# Patient Record
Sex: Male | Born: 1950 | Race: Black or African American | Hispanic: No | Marital: Single | Smoking: Never smoker
Health system: Southern US, Community
[De-identification: ages and names within clinical notes are randomized; demographics above are authoritative.]

## PROBLEM LIST (undated history)

## (undated) DIAGNOSIS — M1990 Unspecified osteoarthritis, unspecified site: Secondary | ICD-10-CM

## (undated) DIAGNOSIS — K7689 Other specified diseases of liver: Secondary | ICD-10-CM

## (undated) DIAGNOSIS — H547 Unspecified visual loss: Secondary | ICD-10-CM

## (undated) DIAGNOSIS — K759 Inflammatory liver disease, unspecified: Secondary | ICD-10-CM

## (undated) DIAGNOSIS — B20 Human immunodeficiency virus [HIV] disease: Secondary | ICD-10-CM

## (undated) DIAGNOSIS — I10 Essential (primary) hypertension: Secondary | ICD-10-CM

## (undated) DIAGNOSIS — I509 Heart failure, unspecified: Secondary | ICD-10-CM

## (undated) DIAGNOSIS — Z85048 Personal history of other malignant neoplasm of rectum, rectosigmoid junction, and anus: Secondary | ICD-10-CM

## (undated) DIAGNOSIS — H409 Unspecified glaucoma: Secondary | ICD-10-CM

## (undated) DIAGNOSIS — Z21 Asymptomatic human immunodeficiency virus [HIV] infection status: Secondary | ICD-10-CM

## (undated) HISTORY — DX: Inflammatory liver disease, unspecified: K75.9

## (undated) HISTORY — PX: GLAUCOMA SURGERY: SHX656

## (undated) HISTORY — DX: Unspecified visual loss: H54.7

## (undated) HISTORY — DX: Personal history of other malignant neoplasm of rectum, rectosigmoid junction, and anus: Z85.048

## (undated) HISTORY — DX: Other specified diseases of liver: K76.89

## (undated) HISTORY — DX: Heart failure, unspecified: I50.9

## (undated) HISTORY — PX: LAPAROSCOPIC DIVERTED COLOSTOMY: SHX5892

---

## 2017-06-23 DIAGNOSIS — N493 Fournier gangrene: Secondary | ICD-10-CM | POA: Insufficient documentation

## 2019-01-07 ENCOUNTER — Emergency Department (HOSPITAL_COMMUNITY): Payer: Medicare Other

## 2019-01-07 ENCOUNTER — Other Ambulatory Visit: Payer: Self-pay

## 2019-01-07 ENCOUNTER — Emergency Department (HOSPITAL_COMMUNITY)
Admission: EM | Admit: 2019-01-07 | Discharge: 2019-01-08 | Disposition: A | Discharge status: Home / Self Care | Payer: Medicare Other | Attending: Emergency Medicine | Admitting: Emergency Medicine

## 2019-01-07 ENCOUNTER — Encounter (HOSPITAL_COMMUNITY): Payer: Self-pay

## 2019-01-07 DIAGNOSIS — I10 Essential (primary) hypertension: Secondary | ICD-10-CM | POA: Insufficient documentation

## 2019-01-07 DIAGNOSIS — Z21 Asymptomatic human immunodeficiency virus [HIV] infection status: Secondary | ICD-10-CM | POA: Insufficient documentation

## 2019-01-07 DIAGNOSIS — R600 Localized edema: Secondary | ICD-10-CM | POA: Insufficient documentation

## 2019-01-07 DIAGNOSIS — M79604 Pain in right leg: Secondary | ICD-10-CM | POA: Diagnosis present

## 2019-01-07 DIAGNOSIS — Z79899 Other long term (current) drug therapy: Secondary | ICD-10-CM | POA: Diagnosis not present

## 2019-01-07 HISTORY — DX: Asymptomatic human immunodeficiency virus (hiv) infection status: Z21

## 2019-01-07 HISTORY — DX: Human immunodeficiency virus (HIV) disease: B20

## 2019-01-07 HISTORY — DX: Unspecified osteoarthritis, unspecified site: M19.90

## 2019-01-07 HISTORY — DX: Unspecified glaucoma: H40.9

## 2019-01-07 HISTORY — DX: Essential (primary) hypertension: I10

## 2019-01-07 LAB — CBC WITH DIFFERENTIAL/PLATELET
Abs Immature Granulocytes: 0.01 10*3/uL (ref 0.00–0.07)
Basophils Absolute: 0 10*3/uL (ref 0.0–0.1)
Basophils Relative: 0 %
Eosinophils Absolute: 0.3 10*3/uL (ref 0.0–0.5)
Eosinophils Relative: 5 %
HCT: 34.3 % — ABNORMAL LOW (ref 39.0–52.0)
Hemoglobin: 11 g/dL — ABNORMAL LOW (ref 13.0–17.0)
Immature Granulocytes: 0 %
Lymphocytes Relative: 43 %
Lymphs Abs: 2 10*3/uL (ref 0.7–4.0)
MCH: 31.3 pg (ref 26.0–34.0)
MCHC: 32.1 g/dL (ref 30.0–36.0)
MCV: 97.4 fL (ref 80.0–100.0)
Monocytes Absolute: 0.6 10*3/uL (ref 0.1–1.0)
Monocytes Relative: 13 %
Neutro Abs: 1.8 10*3/uL (ref 1.7–7.7)
Neutrophils Relative %: 39 %
Platelets: 194 10*3/uL (ref 150–400)
RBC: 3.52 MIL/uL — ABNORMAL LOW (ref 4.22–5.81)
RDW: 14.6 % (ref 11.5–15.5)
WBC: 4.6 10*3/uL (ref 4.0–10.5)
nRBC: 0 % (ref 0.0–0.2)

## 2019-01-07 LAB — COMPREHENSIVE METABOLIC PANEL
ALT: 25 U/L (ref 0–44)
AST: 27 U/L (ref 15–41)
Albumin: 3.5 g/dL (ref 3.5–5.0)
Alkaline Phosphatase: 84 U/L (ref 38–126)
Anion gap: 6 (ref 5–15)
BUN: 18 mg/dL (ref 8–23)
CO2: 25 mmol/L (ref 22–32)
Calcium: 8.6 mg/dL — ABNORMAL LOW (ref 8.9–10.3)
Chloride: 106 mmol/L (ref 98–111)
Creatinine, Ser: 0.76 mg/dL (ref 0.61–1.24)
GFR calc Af Amer: >60 mL/min (ref >60)
GFR calc non Af Amer: >60 mL/min (ref >60)
Glucose, Bld: 97 mg/dL (ref 70–99)
Potassium: 3.2 mmol/L — ABNORMAL LOW (ref 3.5–5.1)
Sodium: 137 mmol/L (ref 135–145)
Total Bilirubin: 0.7 mg/dL (ref 0.3–1.2)
Total Protein: 7.4 g/dL (ref 6.5–8.1)

## 2019-01-07 LAB — TROPONIN I (HIGH SENSITIVITY): Troponin I (High Sensitivity): 3 ng/L (ref <18)

## 2019-01-07 LAB — URINALYSIS, ROUTINE W REFLEX MICROSCOPIC
Bilirubin Urine: NEGATIVE
Glucose, UA: NEGATIVE mg/dL
Hgb urine dipstick: NEGATIVE
Ketones, ur: NEGATIVE mg/dL
Leukocytes,Ua: NEGATIVE
Nitrite: NEGATIVE
Protein, ur: NEGATIVE mg/dL
Specific Gravity, Urine: 1.014 (ref 1.005–1.030)
pH: 6 (ref 5.0–8.0)

## 2019-01-07 LAB — BRAIN NATRIURETIC PEPTIDE: B Natriuretic Peptide: 22 pg/mL (ref 0.0–100.0)

## 2019-01-07 MED ORDER — FUROSEMIDE 20 MG PO TABS: 20.0000 mg | ORAL_TABLET | Freq: Every day | ORAL | 11 refills | Status: DC

## 2019-01-07 MED ORDER — FUROSEMIDE 40 MG PO TABS
40.0000 mg | ORAL_TABLET | Freq: Once | ORAL | Status: AC
  Administered 2019-01-07: 40 mg via ORAL
  Filled 2019-01-07: qty 1

## 2019-01-07 MED ORDER — POTASSIUM CHLORIDE ER 10 MEQ PO TBCR: 10.0000 meq | EXTENDED_RELEASE_TABLET | Freq: Every day | ORAL | 2 refills | Status: DC

## 2019-01-07 MED ORDER — POTASSIUM CHLORIDE CRYS ER 20 MEQ PO TBCR
40.0000 meq | EXTENDED_RELEASE_TABLET | Freq: Once | ORAL | Status: AC
  Administered 2019-01-07: 40 meq via ORAL
  Filled 2019-01-07: qty 2

## 2019-01-07 NOTE — ED Triage Notes (Signed)
Pt arrives from home via [facility] c/o bilateral feet swelling. Pt reports swelling has been progressively getting worse over the past few days. Swelling noted to be moving up legs.

## 2019-01-07 NOTE — Discharge Instructions (Addendum)
Elevate your legs when they begin to swell.   Get your records from your MD.   Schedule to see a local MD.

## 2019-01-07 NOTE — ED Notes (Signed)
Condom catheter placed on patient.

## 2019-01-07 NOTE — ED Notes (Signed)
ED Provider at bedside. 

## 2019-01-07 NOTE — ED Notes (Signed)
EKG done and seen by Dr Thurnell Garbe

## 2019-01-08 NOTE — ED Notes (Signed)
Pt being discharged  Second troponin canceled  First troponin within normal limits

## 2019-01-08 NOTE — ED Provider Notes (Signed)
[facility] EMERGENCY DEPARTMENT Provider Note   CSN: [id number] Arrival date & time: 01/07/19  2118     History   Chief Complaint Chief Complaint  Patient presents with  . Foot Swelling    HPI Ruben Powell is a 68 y.o. male.     The history is provided by the patient. No language interpreter was used.  Leg Pain Location:  Leg Injury: no   Leg location:  L leg, R leg, L lower leg and R lower leg Pain details:    Quality:  Aching   Severity:  No pain   Onset quality:  Gradual   Timing:  Constant   Progression:  Worsening Chronicity:  New Relieved by:  Nothing Worsened by:  Nothing Ineffective treatments:  None tried Risk factors: no concern for non-accidental trauma    Pt is blind.  He reports he is out of colostomy bags.  He has swelling in his legs.  Pt states he has HIV and some type of cancer.  He does not know what kind.   Pt just moved to [place] no MD.  Past Medical History:  Diagnosis Date  . Arthritis   . Glaucoma    bilateral eyes, blindness  . HIV (human immunodeficiency virus infection) ([place])   . Hypertension     There are no active problems to display for this patient.   History reviewed. No pertinent surgical history.      Home Medications    Prior to Admission medications   Medication Sig Start Date End Date Taking? Authorizing Provider  allopurinol (ZYLOPRIM) 100 MG tablet Take 100 mg by mouth 2 (two) times daily.   Yes [provider]  amLODipine (NORVASC) 10 MG tablet Take 10 mg by mouth daily.   Yes [provider]  emtricitabine (EMTRIVA) 200 MG capsule Take 200 mg by mouth daily. Medication called [place]   Yes [provider]  metoprolol succinate (TOPROL-XL) 50 MG 24 hr tablet Take 50 mg by mouth daily. Take with or immediately following a meal.   Yes [provider]  furosemide (LASIX) 20 MG tablet Take 1 tablet (20 mg total) by mouth daily. 01/07/19 01/07/20  Fransico Meadow, PA-C  potassium  chloride (K-DUR) 10 MEQ tablet Take 1 tablet (10 mEq total) by mouth daily. 01/07/19   Fransico Meadow, PA-C    Family History Family History  Problem Relation Age of Onset  . Cancer Mother     Social History Social History   Tobacco Use  . Smoking status: Never Smoker  . Smokeless tobacco: Never Used  Substance Use Topics  . Alcohol use: Yes  . Drug use: Not Currently     Allergies   Patient has no known allergies.   Review of Systems Review of Systems  Cardiovascular: Positive for leg swelling.  Musculoskeletal: Positive for myalgias.  All other systems reviewed and are negative.    Physical Exam Updated Vital Signs BP 106/88 (BP Location: Right Arm)   Pulse (!) 59   Temp 98 F (36.7 C) (Oral)   Resp 18   Ht 5\' 11"  (1.803 m)   Wt 68 kg   SpO2 97%   BMI 20.92 kg/m   Physical Exam Vitals signs and nursing note reviewed.  Constitutional:      Appearance: He is well-developed.  HENT:     Head: Normocephalic and atraumatic.  Eyes:     Conjunctiva/sclera: Conjunctivae normal.  Neck:     Musculoskeletal: Neck supple.  Cardiovascular:  Rate and Rhythm: Normal rate and regular rhythm.     Heart sounds: No murmur.  Pulmonary:     Effort: Pulmonary effort is normal. No respiratory distress.     Breath sounds: Normal breath sounds.  Abdominal:     Palpations: Abdomen is soft.     Tenderness: There is no abdominal tenderness.  Musculoskeletal:     Right lower leg: Edema present.     Left lower leg: Edema present.  Skin:    General: Skin is warm and dry.  Neurological:     General: No focal deficit present.     Mental Status: He is alert.      ED Treatments / Results  Labs (all labs ordered are listed, but only abnormal results are displayed) Labs Reviewed  CBC WITH DIFFERENTIAL/PLATELET - Abnormal; Notable for the following components:      Result Value   RBC 3.52 (*)    Hemoglobin 11.0 (*)    HCT 34.3 (*)    All other components within  normal limits  COMPREHENSIVE METABOLIC PANEL - Abnormal; Notable for the following components:   Potassium 3.2 (*)    Calcium 8.6 (*)    All other components within normal limits  URINALYSIS, ROUTINE W REFLEX MICROSCOPIC  BRAIN NATRIURETIC PEPTIDE  TROPONIN I (HIGH SENSITIVITY)  TROPONIN I (HIGH SENSITIVITY)    EKG EKG Interpretation  Date/Time:  Friday January 07 2019 22:11:22 EDT Ventricular Rate:  56 PR Interval:    QRS Duration: 163 QT Interval:  497 QTC Calculation: 480 R Axis:   70 Text Interpretation:  Sinus rhythm Prolonged PR interval Right bundle branch block No old tracing to compare Confirmed by Samuel JesterMcManus, Kathleen [phone]([id number]) on 01/07/2019 10:14:45 PM   Radiology Dg Chest Port 1 View  Result Date: 01/07/2019 CLINICAL DATA:  Lower extremity swelling history of hypertension, HIV EXAM: PORTABLE CHEST 1 VIEW COMPARISON:  None. FINDINGS: Features of interstitial edema with cephalized vascularity septal thickening. No focal consolidation. No pneumothorax or effusion. Prominent cardiac silhouette may be related to portable technique. The aorta is calcified and mildly tortuous. No acute osseous or soft tissue abnormality. IMPRESSION: Findings compatible with CHF. Electronically Signed   By: Kreg ShropshirePrice  DeHay M.D.   On: 01/07/2019 21:57    Procedures Procedures (including critical care time)  Medications Ordered in ED Medications  furosemide (LASIX) tablet 40 mg (40 mg Oral Given 01/07/19 2324)  potassium chloride SA (K-DUR) CR tablet 40 mEq (40 mEq Oral Given 01/07/19 2323)     Initial Impression / Assessment and Plan / ED Course  I have reviewed the triage vital signs and the nursing notes.  Pertinent labs & imaging results that were available during my care of the patient were reviewed by me and considered in my medical decision making (see chart for details).        Pt given lasix 20 mg and potassium 40 meq.  Home health and social work consult requested.   Final Clinical  Impressions(s) / ED Diagnoses   Final diagnoses:  Bilateral lower extremity edema    ED Discharge Orders         Ordered    furosemide (LASIX) 20 MG tablet  Daily     01/07/19 2341    potassium chloride (K-DUR) 10 MEQ tablet  Daily     01/07/19 2342        An After Visit Summary was printed and given to the patient.    Elson AreasSofia, Leslie K, [place]PA-C 01/08/19 [phone]  Samuel JesterMcManus, Kathleen, DO 01/09/19 1524

## 2019-01-13 ENCOUNTER — Ambulatory Visit: Payer: Self-pay

## 2019-01-14 ENCOUNTER — Other Ambulatory Visit: Payer: Self-pay

## 2019-01-14 ENCOUNTER — Encounter (HOSPITAL_COMMUNITY): Payer: Self-pay | Admitting: *Deleted

## 2019-01-14 ENCOUNTER — Emergency Department (HOSPITAL_COMMUNITY)
Admission: EM | Admit: 2019-01-14 | Discharge: 2019-01-14 | Disposition: A | Discharge status: Home / Self Care | Payer: Medicare Other | Attending: Emergency Medicine | Admitting: Emergency Medicine

## 2019-01-14 DIAGNOSIS — I10 Essential (primary) hypertension: Secondary | ICD-10-CM | POA: Diagnosis not present

## 2019-01-14 DIAGNOSIS — B20 Human immunodeficiency virus [HIV] disease: Secondary | ICD-10-CM | POA: Insufficient documentation

## 2019-01-14 DIAGNOSIS — Z79899 Other long term (current) drug therapy: Secondary | ICD-10-CM | POA: Diagnosis not present

## 2019-01-14 DIAGNOSIS — K9409 Other complications of colostomy: Secondary | ICD-10-CM | POA: Insufficient documentation

## 2019-01-14 NOTE — ED Triage Notes (Signed)
Pt brought in by [facility] with c/o intestines coming through his colostomy bag for the last few hours. EMS reports that this has happened several times and pt reports "it usually goes back on it's own". Pt denies pain.

## 2019-01-14 NOTE — ED Provider Notes (Signed)
[facility] EMERGENCY DEPARTMENT Provider Note   CSN: [id number] Arrival date & time: 01/14/19  1655     History   Chief Complaint Chief Complaint  Patient presents with  . Colostomy Problem    HPI Ruben Powell is a 68 y.o. male.     The history is provided by the patient. No language interpreter was used.     68 year old male with history of HIV, he also has a colostomy bag presenting complaining of herniations of his ostomy bag.  Patient states since this morning, he notices intestines oozing out from his ostomy.  He denies any associated abdominal pain nausea vomiting or fever.  He denies any recent trauma.  He mention this has happened in the past and usually retract on its own.  It normally happens once or twice a month.  He does not have any other specific complaint.  He last changed the ostomy bag 4 days ago.  It is usually changed once weekly.  He has had colostomy for the past 2 years.  Past Medical History:  Diagnosis Date  . Arthritis   . Glaucoma    bilateral eyes, blindness  . HIV (human immunodeficiency virus infection) ([place])   . Hypertension     There are no active problems to display for this patient.   History reviewed. No pertinent surgical history.      Home Medications    Prior to Admission medications   Medication Sig Start Date End Date Taking? Authorizing Provider  allopurinol (ZYLOPRIM) 100 MG tablet Take 100 mg by mouth 2 (two) times daily.    [provider]  amLODipine (NORVASC) 10 MG tablet Take 10 mg by mouth daily.    [provider]  emtricitabine (EMTRIVA) 200 MG capsule Take 200 mg by mouth daily. Medication called [place]    [provider]  furosemide (LASIX) 20 MG tablet Take 1 tablet (20 mg total) by mouth daily. 01/07/19 01/07/20  Fransico Meadow, PA-C  metoprolol succinate (TOPROL-XL) 50 MG 24 hr tablet Take 50 mg by mouth daily. Take with or immediately following a meal.    [provider]   potassium chloride (K-DUR) 10 MEQ tablet Take 1 tablet (10 mEq total) by mouth daily. 01/07/19   Fransico Meadow, PA-C    Family History Family History  Problem Relation Age of Onset  . Cancer Mother     Social History Social History   Tobacco Use  . Smoking status: Never Smoker  . Smokeless tobacco: Never Used  Substance Use Topics  . Alcohol use: Yes  . Drug use: Not Currently     Allergies   Patient has no known allergies.   Review of Systems Review of Systems  All other systems reviewed and are negative.    Physical Exam Updated Vital Signs Ht 5\' 11"  (1.803 m)   Wt 68 kg   BMI 20.92 kg/m   Physical Exam Vitals signs and nursing note reviewed.  Constitutional:      General: He is not in acute distress.    Appearance: He is well-developed.  HENT:     Head: Atraumatic.  Eyes:     Conjunctiva/sclera: Conjunctivae normal.  Neck:     Musculoskeletal: Neck supple.  Abdominal:     General: Abdomen is flat.     Palpations: Abdomen is soft.     Tenderness: There is no abdominal tenderness.     Comments: My intestinal herniation through the stoma in the colostomy bag on the  left abdomen.  It is nontender to palpation.  Stoma with normal skin appearance.  Bowel sounds are present.  Skin:    Findings: No rash.  Neurological:     Mental Status: He is alert.      ED Treatments / Results  Labs (all labs ordered are listed, but only abnormal results are displayed) Labs Reviewed - No data to display  EKG None  Radiology No results found.  Procedures Procedures (including critical care time)  Medications Ordered in ED Medications - No data to display   Initial Impression / Assessment and Plan / ED Course  I have reviewed the triage vital signs and the nursing notes.  Pertinent labs & imaging results that were available during my care of the patient were reviewed by me and considered in my medical decision making (see chart for details).         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  That has happened in the past but usually retract on their own.  He mention he is normally able to manipulate it back in.  Encourage patient to continue to manipulate his stoma.  At this time he does not have any significant pain to suggest strangulation or incarceration.  Will monitor closely.  5:54 PM Pt was able to manipulate his stoma back to normal position.  Care discussed with Dr. Ranae PalmsYelverton.  Pt stable for discharge.    Fayrene Helperran, Beda Dula, PA-C 01/14/19 Silva [id number]    Loren RacerYelverton, David, MD 01/14/19 2209

## 2019-01-14 NOTE — Discharge Instructions (Signed)
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  Return if you develop pain, nausea, vomiting, worsening of your symptoms or if you have other concerns.

## 2019-01-27 ENCOUNTER — Ambulatory Visit: Payer: Self-pay

## 2019-02-09 ENCOUNTER — Encounter: Payer: Self-pay | Admitting: Internal Medicine

## 2019-02-09 ENCOUNTER — Other Ambulatory Visit: Payer: Self-pay

## 2019-02-09 ENCOUNTER — Ambulatory Visit (INDEPENDENT_AMBULATORY_CARE_PROVIDER_SITE_OTHER): Payer: Medicare Other | Admitting: Internal Medicine

## 2019-02-09 ENCOUNTER — Telehealth: Payer: Self-pay | Admitting: *Deleted

## 2019-02-09 VITALS — BP 149/94 | HR 61 | Temp 98.1°F | Ht 71.0 in | Wt 164.9 lb

## 2019-02-09 DIAGNOSIS — I10 Essential (primary) hypertension: Secondary | ICD-10-CM | POA: Insufficient documentation

## 2019-02-09 DIAGNOSIS — Z433 Encounter for attention to colostomy: Secondary | ICD-10-CM | POA: Insufficient documentation

## 2019-02-09 DIAGNOSIS — H409 Unspecified glaucoma: Secondary | ICD-10-CM | POA: Insufficient documentation

## 2019-02-09 DIAGNOSIS — H538 Other visual disturbances: Secondary | ICD-10-CM | POA: Diagnosis not present

## 2019-02-09 DIAGNOSIS — Z79899 Other long term (current) drug therapy: Secondary | ICD-10-CM

## 2019-02-09 DIAGNOSIS — B20 Human immunodeficiency virus [HIV] disease: Secondary | ICD-10-CM | POA: Insufficient documentation

## 2019-02-09 DIAGNOSIS — M7989 Other specified soft tissue disorders: Secondary | ICD-10-CM | POA: Diagnosis not present

## 2019-02-09 DIAGNOSIS — R600 Localized edema: Secondary | ICD-10-CM | POA: Insufficient documentation

## 2019-02-09 DIAGNOSIS — M109 Gout, unspecified: Secondary | ICD-10-CM | POA: Insufficient documentation

## 2019-02-09 DIAGNOSIS — R0602 Shortness of breath: Secondary | ICD-10-CM

## 2019-02-09 DIAGNOSIS — Z21 Asymptomatic human immunodeficiency virus [HIV] infection status: Secondary | ICD-10-CM | POA: Diagnosis not present

## 2019-02-09 DIAGNOSIS — Z8639 Personal history of other endocrine, nutritional and metabolic disease: Secondary | ICD-10-CM

## 2019-02-09 DIAGNOSIS — Z933 Colostomy status: Secondary | ICD-10-CM | POA: Insufficient documentation

## 2019-02-09 DIAGNOSIS — E119 Type 2 diabetes mellitus without complications: Secondary | ICD-10-CM | POA: Insufficient documentation

## 2019-02-09 LAB — POCT GLYCOSYLATED HEMOGLOBIN (HGB A1C): Hemoglobin A1C: 5.3 % (ref 4.0–5.6)

## 2019-02-09 LAB — GLUCOSE, CAPILLARY: Glucose-Capillary: 90 mg/dL (ref 70–99)

## 2019-02-09 MED ORDER — FUROSEMIDE 20 MG PO TABS: 20.0000 mg | ORAL_TABLET | Freq: Every day | ORAL | 11 refills | Status: DC

## 2019-02-09 MED ORDER — ALLOPURINOL 100 MG PO TABS: 100.0000 mg | ORAL_TABLET | Freq: Two times a day (BID) | ORAL | 0 refills | Status: DC

## 2019-02-09 MED ORDER — POTASSIUM CHLORIDE ER 10 MEQ PO TBCR: 10.0000 meq | EXTENDED_RELEASE_TABLET | Freq: Every day | ORAL | 2 refills | Status: DC

## 2019-02-09 MED ORDER — METOPROLOL SUCCINATE ER 50 MG PO TB24: 50.0000 mg | ORAL_TABLET | Freq: Every day | ORAL | 0 refills | Status: DC

## 2019-02-09 NOTE — Telephone Encounter (Signed)
Pt in need of ostomy supplies and does not have a preference in which company provides it.  CMA placed call  to [employer] to request assistance with ordering ostomy supplies.  [employer] asked that order be faxed to [phone] and order was faxed.  Of note, pt uses [employer] # [id number] bags.Regenia Skeeter, Roselee Tayloe Cassady8/19/202012:07 PM

## 2019-02-09 NOTE — Assessment & Plan Note (Addendum)
Unknown how long patient has had colostomy or for what reason but most recently admitted to the ED in July 2020 due to possible herniation of his stoma.  Patient admits to previous cancer diagnosis but uncertain of the etiology.  Based on his description it is likely he suffered from either anal cancer or colorectal cancer.  Today the stoma looks clean and moist.  He states that he has good output each day.  Plan: - I put in a referral for home health evaluation for ostomy care. - I got the patient to sign a records request form to determine what type of cancer he was treated for. - Set the patient up for ostomy supplies.

## 2019-02-09 NOTE — Assessment & Plan Note (Signed)
Patient admits to understanding lower extremity edema.  He was recently was prescribed furosemide 20 mg daily at his most recent urgent care visit in July 2020.  Patient also has bilateral inspiratory wheezing but no signs of JVD.  Will likely need an echocardiogram to assess his heart function.  Plan: - I refilled his furosemide today.

## 2019-02-09 NOTE — Assessment & Plan Note (Signed)
Patient states that he has a history of diabetes although we do not have any previous records or lab work to substantiate his claim.  Plan: - Perform hemoglobin A1c today - We will follow-up in 2 weeks to determine if patient needs further evaluation and management.

## 2019-02-09 NOTE — Addendum Note (Signed)
Addended by: Marcelino Duster on: 02/09/2019 11:21 AM   Modules accepted: Orders

## 2019-02-09 NOTE — Patient Instructions (Addendum)
Thank you, Ruben Powell for allowing [place] to provide your care today. Today we discussed hypertension, HIV, colostomy care, referral for home health, and referral to infectious disease.    I have ordered hemoglobin A1c, BMP labs for you. I will call if any are abnormal.    I have place a referrals to Infectious Disease for HIV assessment and management.  As well as home health referral  I have ordered the following tests: None today  We changes the following medications: I will refill all of your medications today except for your HIV medications  Please follow-up in a week  Should you have any questions or concerns please call the internal medicine clinic at [phone].    Marianna Payment, D.O. [facility]

## 2019-02-09 NOTE — Progress Notes (Signed)
   CC: Status care  HPI:  Mr.Alvino Melroy is a 68 y.o. male with a past medical history stated bellow and presents today to establish care. Please see problem based assessment and plan for additional details.   Past Medical History:  Diagnosis Date  . Arthritis   . Glaucoma    bilateral eyes, blindness  . HIV (human immunodeficiency virus infection) ([place])   . Hypertension      Review of Systems: Review of Systems  Constitutional: Negative for chills, fever, malaise/fatigue and weight loss.  Eyes: Positive for blurred vision.  Respiratory: Positive for shortness of breath (with exertion ).   Cardiovascular: Positive for leg swelling. Negative for chest pain and orthopnea.  Gastrointestinal: Negative for abdominal pain, blood in stool, constipation and melena.  Genitourinary: Negative for dysuria.     Vitals:   02/09/19 0852  BP: (!) 149/94  Pulse: 61  Temp: 98.1 F (36.7 C)  TempSrc: Oral  SpO2: 100%  Weight: 164 lb 14.4 oz (74.8 kg)  Height: 5\' 11"  (1.803 m)     Physical Exam: Physical Exam  Constitutional: He is oriented to person, place, and time. No distress.  Neck: No hepatojugular reflux and no JVD present.  Cardiovascular: Normal rate, regular rhythm, normal heart sounds and intact distal pulses. Exam reveals no gallop and no friction rub.  No murmur heard. Pulmonary/Chest: Effort normal. No respiratory distress. He has wheezes (Inspiratory). He exhibits no tenderness.  Abdominal: Soft. He exhibits no distension.  Musculoskeletal:        General: Edema (1+ pitting edema) present.  Neurological: He is alert and oriented to person, place, and time.  Skin: Skin is warm and dry.     Assessment & Plan:   See Encounters Tab for problem based charting.  Patient seen with Dr. Dareen Piano

## 2019-02-09 NOTE — Assessment & Plan Note (Signed)
Ruben Powell has a history of hypertension for which he takes amlodipine 10 mg daily.  He is not the best historian, patient states that he was originally being treated in [place] and recently moved down to [facility].  Patient states that he is currently out of his medication.  His current blood pressure is 149/94.  I have already got the patient to sign release form so that we can see his previous medical history from [place].  Plan: -I refilled his amlodipine -Asked him to follow-up in 2 weeks

## 2019-02-09 NOTE — Assessment & Plan Note (Signed)
Patient has been on treatment for HIV.  He was taking Emtriva.  Is uncertain how long he has been off the medication.  For this reason, I will put in a referral for infectious disease management to determine his viral load and CD4 count and to assess which medication would be appropriate for him at this time.

## 2019-02-10 ENCOUNTER — Telehealth: Payer: Self-pay | Admitting: *Deleted

## 2019-02-10 LAB — BMP8+ANION GAP
Anion Gap: 14 mmol/L (ref 10.0–18.0)
BUN/Creatinine Ratio: 16 (ref 10–24)
BUN: 14 mg/dL (ref 8–27)
CO2: 25 mmol/L (ref 20–29)
Calcium: 9.5 mg/dL (ref 8.6–10.2)
Chloride: 101 mmol/L (ref 96–106)
Creatinine, Ser: 0.85 mg/dL (ref 0.76–1.27)
GFR calc Af Amer: 105 mL/min/{1.73_m2} (ref >59)
GFR calc non Af Amer: 91 mL/min/{1.73_m2} (ref >59)
Glucose: 84 mg/dL (ref 65–99)
Potassium: 4.3 mmol/L (ref 3.5–5.2)
Sodium: 140 mmol/L (ref 134–144)

## 2019-02-10 NOTE — Progress Notes (Signed)
Internal Medicine Clinic Attending  I saw and evaluated the patient.  I personally confirmed the key portions of the history and exam documented by Dr. Coe and I reviewed pertinent patient test results.  The assessment, diagnosis, and plan were formulated together and I agree with the documentation in the resident's note.    

## 2019-02-10 NOTE — Telephone Encounter (Signed)
Received community message from Butch Penny at [place] that they are able to take this patient for [facility] RN. Hubbard Hartshorn, RN, BSN

## 2019-02-10 NOTE — Telephone Encounter (Signed)
Order for Ostomy supplies sent to [place] yesterday. Community Message sent to Janae Sauce at [place] to see if they can take patient for [facility] RN for [facility]. Hubbard Hartshorn, RN, BSN

## 2019-02-14 ENCOUNTER — Telehealth: Payer: Self-pay | Admitting: Internal Medicine

## 2019-02-14 NOTE — Telephone Encounter (Signed)
Called Sarah RN, [facility] - requesting verbal orders for " Start of Care; SW referral and for diapers". Stated pt has medicare and she can order  The diapers so less cost to pt. VO given - if not appropriate, let me know.

## 2019-02-14 NOTE — Telephone Encounter (Signed)
Needs VO orders [phone]  Also wants to order diapers for the patient

## 2019-02-15 NOTE — Telephone Encounter (Signed)
Have sent Community Meassage to Janae Sauce at [place] to learn [facility] date for [facility] RN. Awaiting reply. Hubbard Hartshorn, RN, BSN

## 2019-02-15 NOTE — Telephone Encounter (Signed)
Received following [employer] from [place]:  She had her initial start of care visit on 8/23 and has her next visit scheduled for 8/27. Hubbard Hartshorn, RN, BSN

## 2019-02-16 ENCOUNTER — Encounter: Payer: Self-pay | Admitting: Internal Medicine

## 2019-02-16 ENCOUNTER — Ambulatory Visit (INDEPENDENT_AMBULATORY_CARE_PROVIDER_SITE_OTHER): Payer: Medicare Other | Admitting: Internal Medicine

## 2019-02-16 ENCOUNTER — Telehealth: Payer: Self-pay | Admitting: Internal Medicine

## 2019-02-16 ENCOUNTER — Other Ambulatory Visit: Payer: Self-pay

## 2019-02-16 VITALS — BP 154/89 | HR 51 | Temp 98.4°F | Ht 71.0 in | Wt 163.7 lb

## 2019-02-16 DIAGNOSIS — R600 Localized edema: Secondary | ICD-10-CM | POA: Diagnosis not present

## 2019-02-16 DIAGNOSIS — I10 Essential (primary) hypertension: Secondary | ICD-10-CM

## 2019-02-16 DIAGNOSIS — Z79899 Other long term (current) drug therapy: Secondary | ICD-10-CM | POA: Diagnosis not present

## 2019-02-16 DIAGNOSIS — Z21 Asymptomatic human immunodeficiency virus [HIV] infection status: Secondary | ICD-10-CM | POA: Diagnosis not present

## 2019-02-16 DIAGNOSIS — B20 Human immunodeficiency virus [HIV] disease: Secondary | ICD-10-CM

## 2019-02-16 MED ORDER — LISINOPRIL 10 MG PO TABS: 10.0000 mg | ORAL_TABLET | Freq: Every day | ORAL | 11 refills | Status: DC

## 2019-02-16 NOTE — Progress Notes (Signed)
Internal Medicine Clinic Attending  I saw and evaluated the patient.  I personally confirmed the key portions of the history and exam documented by Dr. Coe and I reviewed pertinent patient test results.  The assessment, diagnosis, and plan were formulated together and I agree with the documentation in the resident's note.    

## 2019-02-16 NOTE — Assessment & Plan Note (Signed)
Referral set up for the patient for infectious disease.  He states that he has an appointment in the beginning of September.

## 2019-02-16 NOTE — Assessment & Plan Note (Addendum)
Patient presents for 2-week follow-up for hypertension.  He is currently taking amlodipine 10 mg and continues to have blood pressure of 154/89.  Patient had a BMP at his last visit that showed no renal insufficiency.   Plan: - I will start him on low-dose lisinopril 10 mg daily - I will see him back in 1 month to adjust to this medication as needed.  Will likely need 20 mg daily in the future. - Patient states that he is not currently taking metoprolol, so I will discontinue it at this time.  He also denies any history of arrhythmia.

## 2019-02-16 NOTE — Telephone Encounter (Signed)
Please call [phone] [employer]. Fax [phone] . Please f/u with Colostomy supplies.  Missing information that was sent on 02/09/2019.

## 2019-02-16 NOTE — Assessment & Plan Note (Signed)
Patient has mild trace edema.  He denies history of heart attack or heart failure.  Plan: -I counseled patient to use compression stockings as needed and discontinue his furosemide 20 mg. - Told the patient to stop taking potassium as well.

## 2019-02-16 NOTE — Progress Notes (Signed)
   CC: Hypertension  HPI:  Mr.Kadarius Dugo is a 68 y.o. male with a past medical history stated below and presents today for follow-up for his hypertension. Please see problem based assessment and plan for additional details.   Past Medical History:  Diagnosis Date  . Arthritis   . Glaucoma    bilateral eyes, blindness  . HIV (human immunodeficiency virus infection) ([place])   . Hypertension      Review of Systems: Review of Systems  Constitutional: Negative for chills, fever and weight loss.  Respiratory: Negative for cough, shortness of breath and wheezing.   Cardiovascular: Positive for leg swelling. Negative for chest pain, orthopnea and claudication.  Gastrointestinal: Negative for abdominal pain.  Genitourinary: Negative for dysuria.     Vitals:   02/16/19 0835  BP: (!) 154/89  Pulse: (!) 51  Temp: 98.4 F (36.9 C)  TempSrc: Oral  SpO2: 100%  Weight: 163 lb 11.2 oz (74.3 kg)  Height: 5\' 11"  (1.803 m)     Physical Exam: Physical Exam  Constitutional: He is well-developed, well-nourished, and in no distress.  Neck: No hepatojugular reflux and no JVD present.  Cardiovascular: Normal rate, regular rhythm, normal heart sounds and intact distal pulses. Exam reveals no gallop and no friction rub.  No murmur heard. Pulmonary/Chest: Effort normal and breath sounds normal.  Abdominal: Soft. There is no hepatosplenomegaly. There is no abdominal tenderness.     Assessment & Plan:   See Encounters Tab for problem based charting.  Patient seen with Dr. Evette Doffing

## 2019-02-16 NOTE — Telephone Encounter (Signed)
Returned call to [employer]. Spoke with Omar Person who was in communication with [place]. Janett Billow stated that the fax she received for ostomy supplies was cut off at the bottom. Explained the order is in [place] and should not need to be refaxed. Janett Billow nor Delilah Shan have access to [employer] but their supervisor does and she will obtain this order for them. Nothing else needed at this time. Hubbard Hartshorn, RN, BSN

## 2019-02-16 NOTE — Patient Instructions (Signed)
Thank you, [name] for allowing [place] to provide your care today. Today we discussed potential.    I have ordered none labs for you. I will call if any are abnormal.    I have place a referrals to none.   I have ordered the following tests: None today  I have ordered the following medication/changed the following medications: Start lisinopril 10 mg daily.  Continue taking amlodipine 10 mg daily  Please follow-up in 1 month so we can reassess your blood pressure.    Should you have any questions or concerns please call the internal medicine clinic at [phone].    Marianna Payment, D.O. [facility]

## 2019-02-17 ENCOUNTER — Other Ambulatory Visit: Payer: Self-pay | Admitting: *Deleted

## 2019-02-17 ENCOUNTER — Other Ambulatory Visit (HOSPITAL_COMMUNITY)
Admission: RE | Admit: 2019-02-17 | Discharge: 2019-02-17 | Disposition: A | Discharge status: Home / Self Care | Payer: Medicare Other | Source: Ambulatory Visit | Attending: Internal Medicine | Admitting: Internal Medicine

## 2019-02-17 ENCOUNTER — Other Ambulatory Visit: Payer: Medicare Other

## 2019-02-17 ENCOUNTER — Ambulatory Visit: Payer: Medicare Other

## 2019-02-17 DIAGNOSIS — Z114 Encounter for screening for human immunodeficiency virus [HIV]: Secondary | ICD-10-CM

## 2019-02-17 DIAGNOSIS — Z79899 Other long term (current) drug therapy: Secondary | ICD-10-CM

## 2019-02-17 DIAGNOSIS — Z113 Encounter for screening for infections with a predominantly sexual mode of transmission: Secondary | ICD-10-CM

## 2019-02-17 DIAGNOSIS — B20 Human immunodeficiency virus [HIV] disease: Secondary | ICD-10-CM

## 2019-02-18 LAB — URINALYSIS
Bilirubin Urine: NEGATIVE
Glucose, UA: NEGATIVE
Hgb urine dipstick: NEGATIVE
Ketones, ur: NEGATIVE
Nitrite: NEGATIVE
Specific Gravity, Urine: 1.022 (ref 1.001–1.03)
pH: 5.5 (ref 5.0–8.0)

## 2019-02-18 LAB — T-HELPER CELL (CD4) - (RCID CLINIC ONLY)
CD4 % Helper T Cell: 27 % — ABNORMAL LOW (ref 33–65)
CD4 T Cell Abs: 585 /uL (ref 400–1790)

## 2019-02-18 LAB — URINE CYTOLOGY ANCILLARY ONLY
Chlamydia: NEGATIVE
Neisseria Gonorrhea: NEGATIVE

## 2019-02-21 LAB — COMPLETE METABOLIC PANEL WITH GFR
AG Ratio: 1 (calc) (ref 1.0–2.5)
ALT: 45 U/L (ref 9–46)
AST: 43 U/L — ABNORMAL HIGH (ref 10–35)
Albumin: 4 g/dL (ref 3.6–5.1)
Alkaline phosphatase (APISO): 102 U/L (ref 35–144)
BUN: 20 mg/dL (ref 7–25)
CO2: 29 mmol/L (ref 20–32)
Calcium: 9.7 mg/dL (ref 8.6–10.3)
Chloride: 104 mmol/L (ref 98–110)
Creat: 0.89 mg/dL (ref 0.70–1.25)
GFR, Est African American: 103 mL/min/{1.73_m2} (ref >=60)
GFR, Est Non African American: 89 mL/min/{1.73_m2} (ref >=60)
Globulin: 4.2 g/dL (calc) — ABNORMAL HIGH (ref 1.9–3.7)
Glucose, Bld: 91 mg/dL (ref 65–99)
Potassium: 4.1 mmol/L (ref 3.5–5.3)
Sodium: 139 mmol/L (ref 135–146)
Total Bilirubin: 0.6 mg/dL (ref 0.2–1.2)
Total Protein: 8.2 g/dL — ABNORMAL HIGH (ref 6.1–8.1)

## 2019-02-21 LAB — CBC WITH DIFFERENTIAL/PLATELET
Absolute Monocytes: 440 cells/uL (ref 200–950)
Basophils Absolute: 32 cells/uL (ref 0–200)
Basophils Relative: 0.6 %
Eosinophils Absolute: 360 cells/uL (ref 15–500)
Eosinophils Relative: 6.8 %
HCT: 33.4 % — ABNORMAL LOW (ref 38.5–50.0)
Hemoglobin: 11.5 g/dL — ABNORMAL LOW (ref 13.2–17.1)
Lymphs Abs: 2374 cells/uL (ref 850–3900)
MCH: 31.5 pg (ref 27.0–33.0)
MCHC: 34.4 g/dL (ref 32.0–36.0)
MCV: 91.5 fL (ref 80.0–100.0)
MPV: 10.6 fL (ref 7.5–12.5)
Monocytes Relative: 8.3 %
Neutro Abs: 2094 cells/uL (ref 1500–7800)
Neutrophils Relative %: 39.5 %
Platelets: 239 10*3/uL (ref 140–400)
RBC: 3.65 10*6/uL — ABNORMAL LOW (ref 4.20–5.80)
RDW: 12.1 % (ref 11.0–15.0)
Total Lymphocyte: 44.8 %
WBC: 5.3 10*3/uL (ref 3.8–10.8)

## 2019-02-21 LAB — HEPATITIS B CORE ANTIBODY, TOTAL: Hep B Core Total Ab: REACTIVE — AB

## 2019-02-21 LAB — LIPID PANEL
Cholesterol: 135 mg/dL (ref <200)
HDL: 39 mg/dL — ABNORMAL LOW (ref >=40)
LDL Cholesterol (Calc): 83 mg/dL (calc)
Non-HDL Cholesterol (Calc): 96 mg/dL (calc) (ref <130)
Total CHOL/HDL Ratio: 3.5 (calc) (ref <5.0)
Triglycerides: 54 mg/dL (ref <150)

## 2019-02-21 LAB — HIV-1/2 AB - DIFFERENTIATION
HIV-1 antibody: POSITIVE — AB
HIV-2 Ab: NEGATIVE

## 2019-02-21 LAB — HIV ANTIBODY (ROUTINE TESTING W REFLEX): HIV 1&2 Ab, 4th Generation: REACTIVE — AB

## 2019-02-21 LAB — RPR: RPR Ser Ql: NONREACTIVE

## 2019-02-21 LAB — QUANTIFERON-TB GOLD PLUS
Mitogen-NIL: >10 IU/mL
NIL: 0.03 IU/mL
QuantiFERON-TB Gold Plus: NEGATIVE
TB1-NIL: 0 IU/mL
TB2-NIL: 0.01 IU/mL

## 2019-02-21 LAB — HEPATITIS B SURFACE ANTIBODY,QUALITATIVE: Hep B S Ab: NONREACTIVE

## 2019-02-21 LAB — HEPATITIS B SURFACE ANTIGEN: Hepatitis B Surface Ag: NONREACTIVE

## 2019-02-21 LAB — HEPATITIS C ANTIBODY
Hepatitis C Ab: NONREACTIVE
SIGNAL TO CUT-OFF: 0.12 (ref <1.00)

## 2019-02-21 LAB — HIV-1 RNA ULTRAQUANT REFLEX TO GENTYP+
HIV 1 RNA Quant: 36 copies/mL — ABNORMAL HIGH
HIV-1 RNA Quant, Log: 1.56 Log copies/mL — ABNORMAL HIGH

## 2019-02-21 LAB — HEPATITIS A ANTIBODY, TOTAL: Hepatitis A AB,Total: NONREACTIVE

## 2019-02-21 LAB — HLA B*5701: HLA-B*5701 w/rflx HLA-B High: NEGATIVE

## 2019-02-25 ENCOUNTER — Encounter: Payer: Self-pay | Admitting: Internal Medicine

## 2019-03-07 ENCOUNTER — Other Ambulatory Visit: Payer: Self-pay | Admitting: Internal Medicine

## 2019-03-07 DIAGNOSIS — M109 Gout, unspecified: Secondary | ICD-10-CM

## 2019-03-07 DIAGNOSIS — I10 Essential (primary) hypertension: Secondary | ICD-10-CM

## 2019-03-07 NOTE — Telephone Encounter (Signed)
The original prescription was discontinued on 02/16/2019 by Marianna Payment, MD for the following reason: Discontinued by provider. Renewing this prescription may not be appropriate.

## 2019-03-08 ENCOUNTER — Encounter: Payer: Self-pay | Admitting: Internal Medicine

## 2019-03-08 ENCOUNTER — Ambulatory Visit (INDEPENDENT_AMBULATORY_CARE_PROVIDER_SITE_OTHER): Payer: Medicare Other | Admitting: Internal Medicine

## 2019-03-08 ENCOUNTER — Telehealth: Payer: Self-pay | Admitting: Pharmacy Technician

## 2019-03-08 ENCOUNTER — Ambulatory Visit (INDEPENDENT_AMBULATORY_CARE_PROVIDER_SITE_OTHER): Payer: Medicare Other | Admitting: Pharmacist

## 2019-03-08 ENCOUNTER — Other Ambulatory Visit: Payer: Self-pay

## 2019-03-08 VITALS — BP 155/95 | HR 63 | Temp 98.1°F

## 2019-03-08 DIAGNOSIS — L738 Other specified follicular disorders: Secondary | ICD-10-CM | POA: Insufficient documentation

## 2019-03-08 DIAGNOSIS — Z23 Encounter for immunization: Secondary | ICD-10-CM | POA: Diagnosis not present

## 2019-03-08 DIAGNOSIS — B20 Human immunodeficiency virus [HIV] disease: Secondary | ICD-10-CM | POA: Diagnosis present

## 2019-03-08 DIAGNOSIS — L731 Pseudofolliculitis barbae: Secondary | ICD-10-CM | POA: Diagnosis not present

## 2019-03-08 MED ORDER — BICTEGRAVIR-EMTRICITAB-TENOFOV 50-200-25 MG PO TABS: 1.0000 | ORAL_TABLET | Freq: Every day | ORAL | 5 refills | Status: DC

## 2019-03-08 NOTE — Telephone Encounter (Signed)
[facility] Patient Advocate Encounter    Findings of the benefits investigation conducted this morning via test claims for the patient's upcoming appointment on 03/08/19 are as follows:   Insurance: [employer]- active medicare Estimated copay amount: $0 Prior Authorization: not required at this time  [facility] Patient Advocate will follow up once patient arrives for their appointment and see which pharmacy they would like to be filled.  Bartholomew Crews, CPhT Specialty Pharmacy Patient [facility] for Infectious Disease Phone: [phone] Fax: [phone] 03/08/2019 8:25 AM

## 2019-03-08 NOTE — Progress Notes (Signed)
Patient ID: Ruben Powell, male    DOB: June 21, 1951, 68 y.o.   MRN: [id number]  Reason for visit: to establish care as a new patient with HIV  HPI:   Patient was first diagnosed he reports in 69.  He was tested as part screening.  The CD4 count is 585, viral load 36 copies.  He reports that he has never been on medication until he got into care last year in [place] and started on Biktarvy.  He is here with his 'friend' who is acting as his caregiver.  She reports he had been off his medication until about 2 months ago and had restarted it.  He reports his weight is good, has a colostomy and his a lot of loose stool.  He develops a rash on his groin that is red and also has a diffuse rash on his body that is very itchy.  He does drink beer every day.  He also is followed in the IM clinic for primary care.    Past Medical History:  Diagnosis Date  . Arthritis   . Glaucoma    bilateral eyes, blindness  . HIV (human immunodeficiency virus infection) ([place])   . Hypertension     Prior to Admission medications   Medication Sig Start Date End Date Taking? Authorizing Provider  amLODipine (NORVASC) 10 MG tablet Take 10 mg by mouth daily.    [provider]  emtricitabine (EMTRIVA) 200 MG capsule Take 200 mg by mouth daily. Medication called [place]    [provider]  lisinopril (ZESTRIL) 10 MG tablet Take 1 tablet (10 mg total) by mouth daily. 02/16/19 02/16/20  Marianna Payment, MD    No Known Allergies  Social History   Tobacco Use  . Smoking status: Never Smoker  . Smokeless tobacco: Never Used  Substance Use Topics  . Alcohol use: Yes  . Drug use: Not Currently  drinks beer daily  Family History  Problem Relation Age of Onset  . Cancer Mother     Review of Systems Constitutional: negative for fevers, chills, sweats and anorexia Respiratory: negative for cough or sputum Integument/breast: positive for rash Musculoskeletal: negative for myalgias and arthralgias All other  systems reviewed and are negative    CONSTITUTIONAL:in no apparent distress  EYES: anicteric HENT: no thrush CARD:Cor RRR RESP:CTA B; normal respiratory effort GI: + colostomy, soft MS:no pedal edema noted SKIN:diffuse macular, circular rash, many healed, some areas of scratching and  NEURO: non-focal  Lab Results  Component Value Date   HIV1RNAQUANT 36 (H) 02/17/2019   No components found for: HIV1GENOTYPRPLUS No components found for: THELPERCELL  Assessment: new patient here with established HIV. Symptomatic at this time with only recently controlled virus and concurrent eosinophilic folliculitis.  Discussed with patient treatment options and side effects, benefits of treatment, long term outcomes.  I discussed the severity of untreated HIV including higher cancer risk, opportunistic infections, renal failure.  Also discussed needing to use condoms, partner disclosure, necessary vaccines, blood monitoring.  All questions answered.    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  Should improve on ARVs in the next 3-4 months.  Can use topical bacitracin and keep clean, don't scratch.  Candidal scrotal lesions - can use topical anti-fungal  Plan: 1) get vaccine record 2) flu shot today 3) Continue Biktarvy, refills sent 4) follow up in 4 months.

## 2019-03-09 NOTE — Progress Notes (Signed)
Patient left before I could meet him. Will catch up with him by phone or when he follows up again.

## 2019-03-10 ENCOUNTER — Telehealth: Payer: Self-pay | Admitting: Internal Medicine

## 2019-03-10 NOTE — Telephone Encounter (Signed)
Pt calling about his his LOC formS that were dropped off.  Pt would like a call back.

## 2019-03-16 ENCOUNTER — Other Ambulatory Visit: Payer: Self-pay

## 2019-03-16 ENCOUNTER — Ambulatory Visit (INDEPENDENT_AMBULATORY_CARE_PROVIDER_SITE_OTHER): Payer: Medicare Other | Admitting: Internal Medicine

## 2019-03-16 VITALS — BP 151/97 | HR 60 | Temp 98.1°F | Ht 71.0 in | Wt 167.8 lb

## 2019-03-16 DIAGNOSIS — I10 Essential (primary) hypertension: Secondary | ICD-10-CM

## 2019-03-16 DIAGNOSIS — L731 Pseudofolliculitis barbae: Secondary | ICD-10-CM | POA: Diagnosis not present

## 2019-03-16 DIAGNOSIS — B20 Human immunodeficiency virus [HIV] disease: Secondary | ICD-10-CM

## 2019-03-16 DIAGNOSIS — L738 Other specified follicular disorders: Secondary | ICD-10-CM

## 2019-03-16 MED ORDER — AMLODIPINE BESYLATE 10 MG PO TABS: 10.0000 mg | ORAL_TABLET | Freq: Every day | ORAL | 0 refills | Status: DC

## 2019-03-16 MED ORDER — BACITRACIN ZINC 500 UNIT/GM EX OINT: TOPICAL_OINTMENT | CUTANEOUS | 0 refills | Status: DC

## 2019-03-16 MED ORDER — LISINOPRIL 10 MG PO TABS: 10.0000 mg | ORAL_TABLET | Freq: Every day | ORAL | 1 refills | Status: DC

## 2019-03-16 NOTE — Telephone Encounter (Signed)
[facility] Patient Advocate Encounter  Patient went to the pharmacy in [place] and the personnel there stated they were unable to fill at this time. Internal medicine clinic at [facility] called to help the patient locate his medication. Receipt confirmation from the pharmacy shows that the script arrived electronically on 09/15 with 5 refills. Patient will see if they have it ready now and if he needs any further assistance, he knows to call us directly. I made sure he wanted it in [place] and he confirmed that was his pharmacy preference.

## 2019-03-16 NOTE — Assessment & Plan Note (Signed)
Patient with viral load of 36 and cd4 count of 585.   -continue biktarvy

## 2019-03-16 NOTE — Patient Instructions (Addendum)
It was a pleasure to see you today Ruben Powell. Please make the following changes:  For your hypertension: -please start takign amlodipine 10mg  daily  -please continue taking lisinopril  For your itching: Please start applying bacitracin ointment  Please go pick up biktarvy at pharmacy in [place]. Your copay should be $0  If you have any questions or concerns, please call our clinic at [phone] between 9am-5pm and after hours call [phone] and ask for the internal medicine resident on call. If you feel you are having a medical emergency please call 911.   Thank you, we look forward to help you remain healthy!  Ruben Mage, MD Internal Medicine PGY3

## 2019-03-16 NOTE — Progress Notes (Addendum)
   CC: Itching  HPI:  [name] is a 68 y.o. male living with HIV, hypertension, eosinophilic folliculitis, gout who presents for itching. Please see problem based charting for evaluation, assessment, and plan.  Patient needed PCS and LOS. Forms were filled out  Past Medical History:  Diagnosis Date  . Arthritis   . Glaucoma    bilateral eyes, blindness  . HIV (human immunodeficiency virus infection) ([place])   . Hypertension    Review of Systems:    Review of Systems  Constitutional: Negative for chills and fever.  Respiratory: Negative for cough and shortness of breath.   Gastrointestinal: Negative for abdominal pain, nausea and vomiting.  Neurological: Negative for dizziness and headaches.   Physical Exam:  Vitals:   03/16/19 1007 03/16/19 1017  BP: (!) 162/90 (!) 151/97  Pulse: (!) 55 60  Temp: 98.1 F (36.7 C)   TempSrc: Oral   SpO2: 100%   Weight: 167 lb 12.8 oz (76.1 kg)   Height: 5\' 11"  (1.803 m)    Physical Exam  Constitutional: He is oriented to person, place, and time. He appears well-developed and well-nourished. No distress.  HENT:  Head: Normocephalic and atraumatic.  Eyes: Conjunctivae are normal.  Cardiovascular: Normal rate, regular rhythm and normal heart sounds.  Respiratory: Effort normal and breath sounds normal. No respiratory distress. He has no wheezes.  GI: Soft. Bowel sounds are normal. He exhibits no distension. There is no abdominal tenderness.  Neurological: He is alert and oriented to person, place, and time.  Skin: He is not diaphoretic. No erythema.  Dry groin without intertrigo  Psychiatric: He has a normal mood and affect. His behavior is normal. Judgment and thought content normal.   Assessment & Plan:   See Encounters Tab for problem based charting.  Patient discussed with Dr. Lynnae January

## 2019-03-16 NOTE — Assessment & Plan Note (Signed)
The patient's blood pressure during this visit was 151/97 and repeat 162/90. The patient is currently supposed to be taking both lisinopril 10mg  qd and amlodipine 10mg  qd. Unfortunately, amlodipine was discontinued by a medical staff member for unknown reason. His last blood pressure visits are   BP Readings from Last 3 Encounters:  03/16/19 (!) 151/97  03/08/19 (!) 155/95  02/16/19 (!) 154/89   Assessment and Plan Patient with uncontrolled blood pressure today. Refilled patient's amlodipine and lisinopril. Follow up in 4 weeks.

## 2019-03-16 NOTE — Assessment & Plan Note (Signed)
  Patient states that he has been feeling itchy all over since especially in his groin area and upper thoracic area. His groin is dry and without any candidal findings.   Assessment and plan Patient likely has eosinophilic folliculitis as patient has not been on any ARV for the past 2 months after he moved to [place]. Per ID, the patient should "ideally improve with ARV in 3-4 months."  -Prescribed topical bacitracin for symptomatic relief

## 2019-03-18 NOTE — Telephone Encounter (Signed)
Addended note

## 2019-03-18 NOTE — Telephone Encounter (Signed)
Patient was seen on 03/16/2019 for PCS and Level of Care (LOC) Physician Attestation and Service Request form completion. PCS form was completed. LOC still needs to be completed. This form has been placed back in Provider's box.  Completed and signed Request for Independent Assessment for [place] of Medical Need faxed to [place] at [phone].  Laurenze L Ducatte9/25/20209:21 AM

## 2019-03-21 NOTE — Progress Notes (Signed)
Internal Medicine Clinic Attending  Case discussed with Dr. Chundi at the time of the visit.  We reviewed the resident's history and exam and pertinent patient test results.  I agree with the assessment, diagnosis, and plan of care documented in the resident's note. 

## 2019-03-22 NOTE — Telephone Encounter (Addendum)
LOC completed by Dr. Lynnae January and faxed by front office staff.  PCS form refaxed to [employer] on 03/22/2019 as they sent fax stating original did not come through correctly on their end. L. Virginio Isidore, BSN, RN-BC

## 2019-03-25 ENCOUNTER — Other Ambulatory Visit: Payer: Self-pay | Admitting: Internal Medicine

## 2019-03-25 DIAGNOSIS — I10 Essential (primary) hypertension: Secondary | ICD-10-CM

## 2019-03-25 DIAGNOSIS — M109 Gout, unspecified: Secondary | ICD-10-CM

## 2019-04-05 NOTE — Telephone Encounter (Signed)
Received faxed PCS form from [facility]. Spoke with April at that Agency and explained completed PCS form was already faxed to [employer]. April requesting that copy to be faxed to them as well for their file at [phone]. April confirmed that patient is receiving care from them without interruption. Original PCS form faxed to [facility]. Hubbard Hartshorn, BSN, RN-BC

## 2019-04-11 ENCOUNTER — Other Ambulatory Visit: Payer: Self-pay | Admitting: Internal Medicine

## 2019-04-11 DIAGNOSIS — M109 Gout, unspecified: Secondary | ICD-10-CM

## 2019-05-05 ENCOUNTER — Ambulatory Visit (INDEPENDENT_AMBULATORY_CARE_PROVIDER_SITE_OTHER): Payer: Medicare Other | Admitting: Internal Medicine

## 2019-05-05 ENCOUNTER — Other Ambulatory Visit (HOSPITAL_COMMUNITY)
Admission: RE | Admit: 2019-05-05 | Discharge: 2019-05-05 | Disposition: A | Discharge status: Home / Self Care | Payer: Medicare Other | Source: Ambulatory Visit | Attending: Internal Medicine | Admitting: Internal Medicine

## 2019-05-05 ENCOUNTER — Encounter: Payer: Self-pay | Admitting: Internal Medicine

## 2019-05-05 VITALS — BP 150/81 | HR 64 | Wt 178.8 lb

## 2019-05-05 DIAGNOSIS — R369 Urethral discharge, unspecified: Secondary | ICD-10-CM | POA: Diagnosis present

## 2019-05-05 DIAGNOSIS — Z79899 Other long term (current) drug therapy: Secondary | ICD-10-CM

## 2019-05-05 DIAGNOSIS — R358 Other polyuria: Secondary | ICD-10-CM

## 2019-05-05 DIAGNOSIS — Z85048 Personal history of other malignant neoplasm of rectum, rectosigmoid junction, and anus: Secondary | ICD-10-CM | POA: Diagnosis not present

## 2019-05-05 DIAGNOSIS — K625 Hemorrhage of anus and rectum: Secondary | ICD-10-CM

## 2019-05-05 DIAGNOSIS — Z85038 Personal history of other malignant neoplasm of large intestine: Secondary | ICD-10-CM

## 2019-05-05 DIAGNOSIS — Z9049 Acquired absence of other specified parts of digestive tract: Secondary | ICD-10-CM

## 2019-05-05 DIAGNOSIS — I10 Essential (primary) hypertension: Secondary | ICD-10-CM

## 2019-05-05 DIAGNOSIS — R600 Localized edema: Secondary | ICD-10-CM | POA: Diagnosis not present

## 2019-05-05 DIAGNOSIS — R3589 Other polyuria: Secondary | ICD-10-CM

## 2019-05-05 MED ORDER — LISINOPRIL 10 MG PO TABS: 20.0000 mg | ORAL_TABLET | Freq: Every day | ORAL | 1 refills | Status: DC

## 2019-05-05 NOTE — Patient Instructions (Signed)
Thank you, [name] for allowing [place] to provide your care today. Today we discussed Medical records, history of colorectal cancer, increased urination, lower extremity edema, rectal bleeding, and high blood pressure.  I have ordered CBC, CMP, Urinalysis, CEA labs for you. I will call if any are abnormal.    I have place a referrals to Gastroenterology for history of colorectal cancer.   I have ordered the following tests: CT scan of your abdomen.   I have ordered the following medication/changed the following medications: increase lisinopril 20 mg daily.    Please follow-up in 2 weeks.    Should you have any questions or concerns please call the internal medicine clinic at [phone].    Ruben Powell, D.O. [facility]

## 2019-05-06 ENCOUNTER — Encounter: Payer: Self-pay | Admitting: Internal Medicine

## 2019-05-06 DIAGNOSIS — R369 Urethral discharge, unspecified: Secondary | ICD-10-CM | POA: Insufficient documentation

## 2019-05-06 LAB — CYTOLOGY, (ORAL, ANAL, URETHRAL) ANCILLARY ONLY
Candida Glabrata: NEGATIVE
Candida Vaginitis: NEGATIVE
Chlamydia: NEGATIVE
Comment: NEGATIVE
Comment: NEGATIVE
Comment: NEGATIVE
Comment: NORMAL
Neisseria Gonorrhea: NEGATIVE

## 2019-05-06 LAB — MICROSCOPIC EXAMINATION
Bacteria, UA: NONE SEEN
Casts: NONE SEEN /lpf

## 2019-05-06 LAB — URINALYSIS, ROUTINE W REFLEX MICROSCOPIC
Bilirubin, UA: NEGATIVE
Glucose, UA: NEGATIVE
Ketones, UA: NEGATIVE
Nitrite, UA: NEGATIVE
Protein,UA: NEGATIVE
RBC, UA: NEGATIVE
Specific Gravity, UA: 1.016 (ref 1.005–1.030)
Urobilinogen, Ur: 0.2 mg/dL (ref 0.2–1.0)
pH, UA: 5.5 (ref 5.0–7.5)

## 2019-05-06 NOTE — Assessment & Plan Note (Signed)
This is my second visit with the patient. He has a long standing history of HTN and was previously treated in [place], [place]. During my initial visit, I continued the patient on amlodipine 10 mg and added lisinopril 10 mg due to a subjective history of diabetes mellitus. Patient does not have diabetes but continues to have uncontrolled HTN with BP as high as 150/80.  Plan: - Continue amlodipine 10 mg - Increase lisinopril to 20 mg daily

## 2019-05-06 NOTE — Assessment & Plan Note (Addendum)
Patient's daughter states that she has seen bright red blood in his diaper and is concerned about a rectal bleed. Patient has a history of colorectal cancer with in the last couple of years that was treated in [facility]. He apparently had a partial colectomy with colostomy. We still have not been able to get records from his providers in [facility]. Since his surgery he has not had post malignancy surveillance/follow up. Rectal exam was performed today without evidence of pain, blood, or lesions/masses in the rectal vault. FBOT was negative for blood.   Plan: - Ordered CT scan with oral and IV contrast  - Ordered CEA levels - Sent referral to Gastroenterology for colonoscopy  - He will likely need a subsequent referral to Oncology for consistent follow up.

## 2019-05-06 NOTE — Progress Notes (Signed)
   CC: HTN   HPI:  [name] is a 68 y.o. male with a past medical history stated below and presents today for follow-up for HTN. Patient complaints of rectal bleeding, lower extremity edema, and polyuria. Please see problem based assessment and plan for additional details.   Past Medical History:  Diagnosis Date  . Arthritis   . Glaucoma    bilateral eyes, blindness  . HIV (human immunodeficiency virus infection) ([place])   . Hypertension     Review of Systems: Review of Systems  Constitutional: Negative for chills, fever and malaise/fatigue.  Cardiovascular: Positive for leg swelling. Negative for chest pain and palpitations.  Skin: Positive for itching (genitals). Negative for rash.     Vitals:   05/05/19 1416  BP: (!) 150/81  Pulse: 64  SpO2: 100%  Weight: 178 lb 12.8 oz (81.1 kg)     Physical Exam: Physical Exam  Constitutional: He is oriented to person, place, and time and well-developed, well-nourished, and in no distress.  HENT:  Head: Normocephalic and atraumatic.  Eyes: EOM are normal.  Neck: Normal range of motion. No JVD present.  Cardiovascular: Normal rate, regular rhythm, normal heart sounds and intact distal pulses. Exam reveals no gallop and no friction rub.  No murmur heard. Pulmonary/Chest: Effort normal and breath sounds normal. No respiratory distress. He exhibits no tenderness.  Abdominal: Soft. He exhibits no distension. There is no abdominal tenderness.  Genitourinary: White  thick and discharge found.  Musculoskeletal: Normal range of motion.        General: Edema present. No tenderness.  Lymphadenopathy:    He has no cervical adenopathy.  Neurological: He is alert and oriented to person, place, and time.  Skin: Skin is warm and dry. Lesion (scrotum) noted.     Assessment & Plan:   See Encounters Tab for problem based charting.  Patient seen with Dr. Philipp Ovens

## 2019-05-06 NOTE — Assessment & Plan Note (Signed)
Patient continue to have lower extremity edema that improves with elevation. Patient has not history of heart failure or CKD. Likely secondary to venous insufficiency. We will need to perform an Echo to rule out CHF and patient will need post malignancy surveillance due to recent history of colorectal cancer.    Plan: - Get patient fitted for compression stockings - Need Echo in the future.

## 2019-05-07 LAB — CBC
Hematocrit: 35.2 % — ABNORMAL LOW (ref 37.5–51.0)
Hemoglobin: 12.2 g/dL — ABNORMAL LOW (ref 13.0–17.7)
MCH: 31 pg (ref 26.6–33.0)
MCHC: 34.7 g/dL (ref 31.5–35.7)
MCV: 90 fL (ref 79–97)
Platelets: 249 10*3/uL (ref 150–450)
RBC: 3.93 x10E6/uL — ABNORMAL LOW (ref 4.14–5.80)
RDW: 12.1 % (ref 11.6–15.4)
WBC: 5.9 10*3/uL (ref 3.4–10.8)

## 2019-05-07 LAB — CMP14 + ANION GAP
ALT: 30 IU/L (ref 0–44)
AST: 36 IU/L (ref 0–40)
Albumin/Globulin Ratio: 0.9 — ABNORMAL LOW (ref 1.2–2.2)
Albumin: 4.1 g/dL (ref 3.8–4.8)
Alkaline Phosphatase: 136 IU/L — ABNORMAL HIGH (ref 39–117)
Anion Gap: 15 mmol/L (ref 10.0–18.0)
BUN/Creatinine Ratio: 20 (ref 10–24)
BUN: 17 mg/dL (ref 8–27)
Bilirubin Total: 0.8 mg/dL (ref 0.0–1.2)
CO2: 23 mmol/L (ref 20–29)
Calcium: 9.6 mg/dL (ref 8.6–10.2)
Chloride: 99 mmol/L (ref 96–106)
Creatinine, Ser: 0.87 mg/dL (ref 0.76–1.27)
GFR calc Af Amer: 104 mL/min/{1.73_m2} (ref >59)
GFR calc non Af Amer: 90 mL/min/{1.73_m2} (ref >59)
Globulin, Total: 4.6 g/dL — ABNORMAL HIGH (ref 1.5–4.5)
Glucose: 86 mg/dL (ref 65–99)
Potassium: 4.3 mmol/L (ref 3.5–5.2)
Sodium: 137 mmol/L (ref 134–144)
Total Protein: 8.7 g/dL — ABNORMAL HIGH (ref 6.0–8.5)

## 2019-05-07 LAB — CEA: CEA: 2.3 ng/mL (ref 0.0–4.7)

## 2019-05-07 LAB — FLUORESCENT TREPONEMAL AB(FTA)-IGG-BLD: Fluorescent Treponemal Ab, IgG: REACTIVE — AB

## 2019-05-07 LAB — RPR: RPR Ser Ql: NONREACTIVE

## 2019-05-07 NOTE — Progress Notes (Addendum)
Internal Medicine Clinic Attending  I saw and evaluated the patient.  I personally confirmed the key portions of the history and exam documented by Dr. Marianna Payment and I reviewed pertinent patient test results.  The assessment, diagnosis, and plan were formulated together and I agree with the documentation in the resident's note.   Syphilis serologies checked due to concern for scrotal ulcer. Negative RPR and positive antibody, consistent with his history of previously treated syphilis. GC/Chlamydia/Candida negative on urethral swab. If urethral discharge persists, consider testing for trichomonas.   Rest per resident note.

## 2019-05-09 ENCOUNTER — Encounter: Payer: Self-pay | Admitting: Gastroenterology

## 2019-05-12 ENCOUNTER — Telehealth: Payer: Self-pay | Admitting: *Deleted

## 2019-05-12 NOTE — Telephone Encounter (Signed)
ROI faxed to office below fax# [phone].Despina Hidden Cassady11/19/20203:04 PM  [facility] Information Physical Site Address [address] [place], [place], [place] [address] Main Phone [phone] Web Address [email or web address]

## 2019-05-16 ENCOUNTER — Other Ambulatory Visit: Payer: Self-pay

## 2019-05-16 ENCOUNTER — Ambulatory Visit (HOSPITAL_COMMUNITY)
Admission: RE | Admit: 2019-05-16 | Discharge: 2019-05-16 | Disposition: A | Discharge status: Home / Self Care | Payer: Medicare Other | Source: Ambulatory Visit | Attending: Internal Medicine | Admitting: Internal Medicine

## 2019-05-16 DIAGNOSIS — K625 Hemorrhage of anus and rectum: Secondary | ICD-10-CM | POA: Insufficient documentation

## 2019-05-16 DIAGNOSIS — Z85048 Personal history of other malignant neoplasm of rectum, rectosigmoid junction, and anus: Secondary | ICD-10-CM | POA: Diagnosis present

## 2019-05-16 MED ORDER — IOHEXOL 300 MG/ML  SOLN
100.0000 mL | Freq: Once | INTRAMUSCULAR | Status: AC | PRN
  Administered 2019-05-16: 100 mL via INTRAVENOUS

## 2019-05-18 ENCOUNTER — Ambulatory Visit (INDEPENDENT_AMBULATORY_CARE_PROVIDER_SITE_OTHER): Payer: Medicare Other | Admitting: Internal Medicine

## 2019-05-18 ENCOUNTER — Encounter: Payer: Self-pay | Admitting: Internal Medicine

## 2019-05-18 ENCOUNTER — Other Ambulatory Visit: Payer: Self-pay

## 2019-05-18 VITALS — BP 138/84 | HR 54 | Temp 97.9°F | Ht 71.0 in | Wt 180.6 lb

## 2019-05-18 DIAGNOSIS — Z85048 Personal history of other malignant neoplasm of rectum, rectosigmoid junction, and anus: Secondary | ICD-10-CM | POA: Diagnosis not present

## 2019-05-18 DIAGNOSIS — I10 Essential (primary) hypertension: Secondary | ICD-10-CM

## 2019-05-18 DIAGNOSIS — R600 Localized edema: Secondary | ICD-10-CM

## 2019-05-18 DIAGNOSIS — R369 Urethral discharge, unspecified: Secondary | ICD-10-CM

## 2019-05-18 DIAGNOSIS — H409 Unspecified glaucoma: Secondary | ICD-10-CM | POA: Diagnosis not present

## 2019-05-18 NOTE — Assessment & Plan Note (Signed)
Placed ophthalmology referral per patient request.

## 2019-05-18 NOTE — Assessment & Plan Note (Signed)
Patient's BP was not at goal during last visit 2 weeks ago and his lisinopril was increased from 10 to 20 mg daily. Continue amlodipine 10 mg daily.  Presents today for follow-up and BP is at goal.  We will continue current regimen.

## 2019-05-18 NOTE — Progress Notes (Signed)
Internal Medicine Clinic Attending  Case discussed with Dr. Santos-Sanchez at the time of the visit.  We reviewed the resident's history and exam and pertinent patient test results.  I agree with the assessment, diagnosis, and plan of care documented in the resident's note.    

## 2019-05-18 NOTE — Progress Notes (Addendum)
   CC: LE edema   HPI:  Ruben Powell is a 68 y.o. year-old male with PMH listed below who presents to clinic for LE edema. Please see problem based assessment and plan for further details.   Past Medical History:  Diagnosis Date  . Arthritis   . Glaucoma    bilateral eyes, blindness  . HIV (human immunodeficiency virus infection) ([place])   . Hypertension    Review of Systems:   Review of Systems  Constitutional: Negative for chills, fever, malaise/fatigue and weight loss.  Respiratory: Negative for cough and shortness of breath.   Cardiovascular: Positive for leg swelling. Negative for chest pain, palpitations, orthopnea and claudication.  Gastrointestinal: Negative for abdominal pain, blood in stool, constipation, diarrhea, melena, nausea and vomiting.     Physical Exam:  Vitals:   05/18/19 0952 05/18/19 1029  BP: (!) 146/93 138/84  Pulse: 63 (!) 54  Temp: 97.9 F (36.6 C)   TempSrc: Oral   SpO2: 100%   Weight: 180 lb 9.6 oz (81.9 kg)   Height: 5\' 11"  (1.803 m)     General: Chronically ill-appearing male in no acute distress Cardiac: regular rate and rhythm, nl S1/S2, no murmurs, rubs or gallops, no JVD  Pulm: CTAB, no wheezes or crackles, no increased work of breathing on room air  Ext: warm and well perfused, 1-2+ peripheral edema bilaterally   Assessment & Plan:   See Encounters Tab for problem based charting.  Patient discussed with Dr. Evette Doffing

## 2019-05-18 NOTE — Assessment & Plan Note (Signed)
This has resolved. Workup was negative per Dr. Rivka Safer note 2 weeks ago.

## 2019-05-18 NOTE — Patient Instructions (Addendum)
Ruben Powell,  Your blood pressure looked excellent. We will continue amlodipine 10 mg and lisinopril 20 mg daily.   For the swelling in your legs, we have to figure out what is causing it. I ordered an ultrasound of your heart. They should hopefully be calling next week to set this up.   We will measure you today for stockings again and get you new ones.   I filled out your paperwork for a home health aide.

## 2019-05-18 NOTE — Assessment & Plan Note (Signed)
Patient was seen 2 weeks ago for bilateral lower extremity edema and presents today for follow-up.  Blood work from previous visit revealed normal renal and liver function and albumin of 4.1. He also had a CT abdomen/pelvis that showed a normal liver. Echocardiogram was recommended to evaluate for heart failure, but not ordered. Today he reports no changes in swelling. He has been elevating his legs as much as he can which helps. But swelling always returns. He denies shortness of breath, orthopnea, and PND. No JVD or crackles on exam.   - Echo ordered, will follow up  - Received fitted compression stockings today  - Will hold off on Lasix at this time, would recommend starting if persistent swelling despite compression stockings or if evidence of Hf on echo

## 2019-05-18 NOTE — Assessment & Plan Note (Signed)
Patient has a history of colorectal cancer s/p colectomy. He has not had follow up since his surgery. He was referred to GI and has an appointment on 12/18 with Dr. Pincus Sanes. CEA obtained during last visit was within normal range. He denies hematochezia or melena.  Has had normal stool output in ostomy bag.

## 2019-05-25 ENCOUNTER — Other Ambulatory Visit: Payer: Self-pay

## 2019-05-25 ENCOUNTER — Ambulatory Visit (HOSPITAL_COMMUNITY)
Admission: RE | Admit: 2019-05-25 | Discharge: 2019-05-25 | Disposition: A | Discharge status: Home / Self Care | Payer: Medicare Other | Source: Ambulatory Visit | Attending: Student in an Organized Health Care Education/Training Program | Admitting: Student in an Organized Health Care Education/Training Program

## 2019-05-25 DIAGNOSIS — I110 Hypertensive heart disease with heart failure: Secondary | ICD-10-CM | POA: Diagnosis not present

## 2019-05-25 DIAGNOSIS — B20 Human immunodeficiency virus [HIV] disease: Secondary | ICD-10-CM | POA: Diagnosis not present

## 2019-05-25 DIAGNOSIS — I5030 Unspecified diastolic (congestive) heart failure: Secondary | ICD-10-CM | POA: Diagnosis present

## 2019-05-25 DIAGNOSIS — R600 Localized edema: Secondary | ICD-10-CM

## 2019-05-25 NOTE — Progress Notes (Signed)
  Echocardiogram 2D Echocardiogram has been performed.  Ruben Powell 05/25/2019, 1:50 PM

## 2019-05-28 ENCOUNTER — Other Ambulatory Visit: Payer: Self-pay | Admitting: Internal Medicine

## 2019-06-10 ENCOUNTER — Ambulatory Visit (INDEPENDENT_AMBULATORY_CARE_PROVIDER_SITE_OTHER): Payer: Medicare Other | Admitting: Gastroenterology

## 2019-06-10 ENCOUNTER — Encounter: Payer: Self-pay | Admitting: Gastroenterology

## 2019-06-10 VITALS — BP 138/82 | HR 64 | Temp 98.5°F | Ht 70.0 in | Wt 183.1 lb

## 2019-06-10 DIAGNOSIS — Z933 Colostomy status: Secondary | ICD-10-CM

## 2019-06-10 DIAGNOSIS — S30817A Abrasion of anus, initial encounter: Secondary | ICD-10-CM

## 2019-06-10 NOTE — Patient Instructions (Signed)
If you are age 66 or older, your body mass index should be between 23-30. Your Body mass index is 26.28 kg/m. If this is out of the aforementioned range listed, please consider follow up with your Primary Care Provider.  If you are age 46 or younger, your body mass index should be between 19-25. Your Body mass index is 26.28 kg/m. If this is out of the aformentioned range listed, please consider follow up with your Primary Care Provider.   Please apply over the counter Desitin to the anal area as needed.   It was a pleasure to see you today!  Dr. Loletha Carrow

## 2019-06-10 NOTE — Progress Notes (Addendum)
[facility] Consult Note:  History: Ruben Powell 06/10/2019  Referring provider: Dellia Cloud, MD  Reason for consult/chief complaint: Rectal Bleeding, Diarrhea (alternating with constipation), Constipation, and colostomy bag (GI hx in [place])   Subjective  HPI:  This is a 68 year old man referred by primary care for concerns of "rectal bleeding". He presents with his partner today, and unfortunately both are poor historians with limited health literacy.  We also have no outside records from his prior care in [place] or [place].  His partner had his daughter on the phone today, but amongst them they could still not agree for sure in which hospital he had been cared for in [place] last year.  All I can determine is that he had some degree of probably left colonic or rectal resection over a year ago living in [place] (?  [place]), and has had an ostomy since then.  He sometimes has blood in his underpants and says the area is uncomfortable and he scratches it frequently.  He does not have any output from the anal canal, and says that he believes there is normal formed stool from the ostomy.  He is visually impaired, but his partner confirms this history.  He has had some intermittent pain or fullness around the ostomy, and has been evaluated in the past for a suspected hernia at that site from an ED visit.  He had the surgery for colon cancer in [place], then it sounds like he may have had some infection or other wound problem afterwards, he had prolonged recovery at a nursing center and perhaps was seen by wound clinic as well.  His daughter then took him home to [place] to recover there in a nursing facility, and he moved down here earlier this year. No other history is available.  ROS:  Review of Systems  Constitutional: Negative for appetite change and unexpected weight change.  HENT: Negative for mouth sores and voice change.    Eyes: Negative for pain and redness.       Visually impaired  Respiratory: Negative for cough and shortness of breath.   Cardiovascular: Negative for chest pain and palpitations.  Genitourinary: Negative for dysuria and hematuria.  Musculoskeletal: Negative for arthralgias and myalgias.       Generalized weakness, says he can walk a little bit prefers to use a wheelchair.  Skin: Negative for pallor and rash.  Neurological: Positive for weakness. Negative for headaches.  Hematological: Negative for adenopathy.     Past Medical History: Past Medical History:  Diagnosis Date  . Arthritis   . Blind   . CHF (congestive heart failure) (HCC)    per CT 2020  . Glaucoma    bilateral eyes, blindness  . Hepatitis   . History of colorectal cancer   . HIV (human immunodeficiency virus infection) (HCC)   . Hypertension   . Liver nodule    per CT 2020     Past Surgical History: Past Surgical History:  Procedure Laterality Date  . GLAUCOMA SURGERY Bilateral   . LAPAROSCOPIC DIVERTED COLOSTOMY       Family History: Family History  Problem Relation Age of Onset  . Cancer Mother        unknown type  . Hypertension Brother   . Cancer Maternal Grandmother        type unknown  . Cancer Maternal Grandfather        type unknown    Social History: Social History  Socioeconomic History  . Marital status: Single    Spouse name: Not on file  . Number of children: 27  . Years of education: Not on file  . Highest education level: Not on file  Occupational History  . Occupation: disabled  Tobacco Use  . Smoking status: Never Smoker  . Smokeless tobacco: Never Used  Substance and Sexual Activity  . Alcohol use: Yes    Comment: occasional beer  . Drug use: Not Currently  . Sexual activity: Not Currently  Other Topics Concern  . Not on file  Social History Narrative   27 children, 7 boys and 20 girls   Social Determinants of Health   Financial Resource Strain:   .  Difficulty of Paying Living Expenses: Not on file  Food Insecurity:   . Worried About Charity fundraiser in the Last Year: Not on file  . Ran Out of Food in the Last Year: Not on file  Transportation Needs:   . Lack of Transportation (Medical): Not on file  . Lack of Transportation (Non-Medical): Not on file  Physical Activity:   . Days of Exercise per Week: Not on file  . Minutes of Exercise per Session: Not on file  Stress:   . Feeling of Stress : Not on file  Social Connections:   . Frequency of Communication with Friends and Family: Not on file  . Frequency of Social Gatherings with Friends and Family: Not on file  . Attends Religious Services: Not on file  . Active Member of Clubs or Organizations: Not on file  . Attends Archivist Meetings: Not on file  . Marital Status: Not on file    Allergies: No Known Allergies  Outpatient Meds: Current Outpatient Medications  Medication Sig Dispense Refill  . amLODipine (NORVASC) 10 MG tablet TAKE 1 TABLET(10 MG) BY MOUTH DAILY 90 tablet 1  . bacitracin ointment Apply to affected area daily 30 g 0  . bictegravir-emtricitabine-tenofovir AF (BIKTARVY) 50-200-25 MG TABS tablet Take 1 tablet by mouth daily. 30 tablet 5  . lisinopril (ZESTRIL) 10 MG tablet Take 2 tablets (20 mg total) by mouth daily. 90 tablet 1   No current facility-administered medications for this visit.      ___________________________________________________________________ Objective   Exam:  BP 138/82 (BP Location: Left Arm, Patient Position: Sitting, Cuff Size: Normal)   Pulse 64   Temp 98.5 F (36.9 C)   Ht 5\' 10"  (1.778 m)   Wt 183 lb 2 oz (83.1 kg)   BMI 26.28 kg/m    General: Debilitated man was brought to exam table with assistance.  Eyes: Cloudy  ENT: oral mucosa moist without lesions, no cervical or supraclavicular lymphadenopathy  CV: RRR without murmur, S1/S2, no JVD, + peripheral edema  Resp: clear to auscultation  bilaterally, normal RR and effort noted  GI: soft, no tenderness, with active bowel sounds. No guarding or palpable organomegaly noted.  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  With help he was able to be turned to the left for perianal and rectal exam with some difficulty.  He appears to have had postsurgical changes in this area as well with multiple scars.  The skin is dry and excoriated some areas.  Decreased resting sphincter tone, he has formed stool in the rectal vault, no palpable mass.  Skin; warm and dry, no rash or jaundice noted  Neuro: awake, alert  and oriented x 3. Normal gross motor function and fluent speech  Labs:  CBC Latest Ref Rng & Units 05/05/2019 02/17/2019 01/07/2019  WBC 3.4 - 10.8 x10E3/uL 5.9 5.3 4.6  Hemoglobin 13.0 - 17.7 g/dL 12.2(L) 11.5(L) 11.0(L)  Hematocrit 37.5 - 51.0 % 35.2(L) 33.4(L) 34.3(L)  Platelets 150 - 450 x10E3/uL 249 239 194   CMP Latest Ref Rng & Units 05/05/2019 02/17/2019 02/09/2019  Glucose 65 - 99 mg/dL 86 91 84  BUN 8 - 27 mg/dL 17 20 14   Creatinine 0.76 - 1.27 mg/dL 1.610.87 0.960.89 0.450.85  Sodium 134 - 144 mmol/L 137 139 140  Potassium 3.5 - 5.2 mmol/L 4.3 4.1 4.3  Chloride 96 - 106 mmol/L 99 104 101  CO2 20 - 29 mmol/L 23 29 25   Calcium 8.6 - 10.2 mg/dL 9.6 9.7 9.5  Total Protein 6.0 - 8.5 g/dL 4.0(J8.7(H) 8.2(H) -  Total Bilirubin 0.0 - 1.2 mg/dL 0.8 0.6 -  Alkaline Phos 39 - 117 IU/L 136(H) - -  AST 0 - 40 IU/L 36 43(H) -  ALT 0 - 44 IU/L 30 45 -     Radiologic Studies:  Recent edchocardiogram: 1. Left ventricular ejection fraction, by visual estimation, is 60 to 65%. The left ventricle has normal function. There is mildly increased left ventricular hypertrophy.  2. Left ventricular diastolic parameters are consistent with Grade I diastolic dysfunction (impaired relaxation).  3. The left ventricle has no regional wall motion abnormalities.   4. Global right ventricle has normal systolic function.The right ventricular size is normal. No increase in right ventricular wall thickness.  5. Left atrial size was normal.  6. Right atrial size was normal.  7. The mitral valve is abnormal. Trace mitral valve regurgitation.  8. The tricuspid valve is grossly normal. Tricuspid valve regurgitation is trivial.  9. The aortic valve is tricuspid. Aortic valve regurgitation is not visualized. 10. The pulmonic valve was grossly normal. Pulmonic valve regurgitation is not visualized. 11. The inferior vena cava is normal in size with greater than 50% respiratory variability, suggesting right atrial pressure of 3 mmHg.   Assessment: Encounter Diagnoses  Name Primary?  . Perianal excoriation Yes  . S/P colostomy (HCC)     This patient's bleeding is from perianal skin irritation.  I recommended applying some barrier cream such as Desitin.  Much more information would be necessary to understand this patient's reported history of colon cancer therefore what kind of follow-up is necessary for that, oncologic, endoscopic, radiographic or otherwise.  Record request was sent to the hospital system where he reportedly received his care in [place] (if history is accurate).  We requested discharge summary and operative report.   Thank you for the courtesy of this consult.  Please call me with any questions or concerns.  Charlie PitterHenry L Danis III  CC: Referring provider noted above ___________________________________________   Addendum for record review 06/16/19:  The only record we received from our request on this patient was an operative report by Dr. Lovena NeighboursJeffrey Bernard Thomas dated 11/11/2017  The indication states the patient had undergone incision and drainage and debridement of subcutaneous tissues in the perirectal region the day prior because of a perirectal abscess with necrotizing soft tissue infection.  He was then having a second procedure  for more extensive debridement and wound dressing change and diverting loop colostomy. A left colon loop colostomy was created. He then had extensive debridement of purulent and necrotic perianal skin and soft tissue.  There is no indication that  the patient had a degree of colonic resection or malignancy identified.  There are no additional records, so the cause of the initial perirectal abscess is unknown.  Amada Jupiter, MD  _____________________________________________________________  Further records obtained on this patient.   06/28/19  Discharge summary from hospitalization May 2019 indicating polymicrobial infection of left buttock requiring extensive surgical debridement.  Wound culture positive for viridans strep, E. coli, gram-negative rods and gram-positive cocci.  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  CD4 count was 365 with HIV count of 1.1 million at that time.  He also had bilateral lower extremity DVTs treated with anticoagulation.  He had a chronic normocytic anemia treated with transfusion.  Found to be B12 deficient.  Patient was discharged to a rehab facility.  There is also discharge summary from the hospital stay 04/29/2018 to 05/17/2018 patient brought to hospital by law enforcement for concern of unsafe living conditions.  He had a sacral decubitus ulcer.  Ellwood Dense, MD

## 2019-06-21 ENCOUNTER — Other Ambulatory Visit: Payer: Medicare Other

## 2019-06-27 ENCOUNTER — Ambulatory Visit: Payer: Medicare Other

## 2019-07-04 ENCOUNTER — Ambulatory Visit: Payer: Medicare Other

## 2019-07-04 ENCOUNTER — Telehealth: Payer: Self-pay

## 2019-07-04 NOTE — Telephone Encounter (Signed)
Recall made for 10/2019

## 2019-07-05 ENCOUNTER — Other Ambulatory Visit: Payer: Self-pay

## 2019-07-05 ENCOUNTER — Encounter: Payer: Self-pay | Admitting: Internal Medicine

## 2019-07-05 ENCOUNTER — Ambulatory Visit (INDEPENDENT_AMBULATORY_CARE_PROVIDER_SITE_OTHER): Payer: Medicare Other | Admitting: Internal Medicine

## 2019-07-05 VITALS — BP 147/94 | HR 71 | Temp 97.9°F | Ht 71.0 in | Wt 185.8 lb

## 2019-07-05 VITALS — BP 147/82 | HR 71 | Temp 97.9°F | Ht 71.0 in | Wt 184.0 lb

## 2019-07-05 DIAGNOSIS — I10 Essential (primary) hypertension: Secondary | ICD-10-CM

## 2019-07-05 DIAGNOSIS — Z21 Asymptomatic human immunodeficiency virus [HIV] infection status: Secondary | ICD-10-CM | POA: Diagnosis present

## 2019-07-05 DIAGNOSIS — Z433 Encounter for attention to colostomy: Secondary | ICD-10-CM

## 2019-07-05 DIAGNOSIS — M47816 Spondylosis without myelopathy or radiculopathy, lumbar region: Secondary | ICD-10-CM | POA: Diagnosis not present

## 2019-07-05 DIAGNOSIS — Z79899 Other long term (current) drug therapy: Secondary | ICD-10-CM | POA: Diagnosis not present

## 2019-07-05 DIAGNOSIS — I700 Atherosclerosis of aorta: Secondary | ICD-10-CM | POA: Insufficient documentation

## 2019-07-05 DIAGNOSIS — R600 Localized edema: Secondary | ICD-10-CM | POA: Diagnosis not present

## 2019-07-05 DIAGNOSIS — Z23 Encounter for immunization: Secondary | ICD-10-CM | POA: Insufficient documentation

## 2019-07-05 DIAGNOSIS — Z85038 Personal history of other malignant neoplasm of large intestine: Secondary | ICD-10-CM | POA: Diagnosis not present

## 2019-07-05 DIAGNOSIS — Z85048 Personal history of other malignant neoplasm of rectum, rectosigmoid junction, and anus: Secondary | ICD-10-CM

## 2019-07-05 DIAGNOSIS — Z933 Colostomy status: Secondary | ICD-10-CM

## 2019-07-05 MED ORDER — LISINOPRIL 10 MG PO TABS: 20.0000 mg | ORAL_TABLET | Freq: Every day | ORAL | 1 refills | Status: AC

## 2019-07-05 MED ORDER — NAPROXEN 500 MG PO TABS: 500.0000 mg | ORAL_TABLET | Freq: Every evening | ORAL | 0 refills | Status: DC

## 2019-07-05 MED ORDER — HYDROCHLOROTHIAZIDE 12.5 MG PO TABS: 12.5000 mg | ORAL_TABLET | Freq: Every day | ORAL | 2 refills | Status: DC

## 2019-07-05 MED ORDER — NAPROXEN 500 MG PO TABS: 500.0000 mg | ORAL_TABLET | Freq: Every evening | ORAL | 0 refills | Status: AC

## 2019-07-05 NOTE — Progress Notes (Signed)
   Subjective:    Patient ID: Ruben Powell, male    DOB: Sep 28, 1950, 69 y.o.   MRN: [id number]  HPI Here for follow up of HIV No new issues.  Established with primary care.  No complaints today. No vaccine record available. Did not get labs prior to the visit.  Hepatitis A and B non-immune.  Complaint of some discomfort around ostomy.    Review of Systems  Constitutional: Negative for fatigue.  Gastrointestinal: Negative for diarrhea and nausea.  Skin: Negative for rash.       Objective:   Physical Exam Constitutional:      Appearance: Normal appearance.  Eyes:     General: No scleral icterus. Pulmonary:     Effort: Pulmonary effort is normal.  Neurological:     Mental Status: He is alert.  Psychiatric:        Mood and Affect: Mood normal.   SH: no tobacco        Assessment & Plan:

## 2019-07-05 NOTE — Assessment & Plan Note (Signed)
Patient wanted to talk to general surgery about colostomy reversal. I will put in the referral today.

## 2019-07-05 NOTE — Assessment & Plan Note (Addendum)
Patient complains of lower back pain. The pain has recently progressed in the last couple of weeks.The pain is worse at night, particularly while laying down. It make it difficult to sleep at night. Denies radiation of pain or radiculopathy.  Plan: - Short course of naproxen 500 mg nightly for 10 days.

## 2019-07-05 NOTE — Assessment & Plan Note (Signed)
Doing well and no missed doses.  Labs today and rtc 6 months unless concerns

## 2019-07-05 NOTE — Assessment & Plan Note (Signed)
Referred patient to GI for further cancer surveillance.

## 2019-07-05 NOTE — Progress Notes (Signed)
   CC: HTN  HPI:  Ruben Powell is a 69 y.o. male with a past medical history stated below and presents today for HTN follow up. Please see problem based assessment and plan for additional details.   Past Medical History:  Diagnosis Date  . Arthritis   . Blind   . CHF (congestive heart failure) (HCC)    per CT 2020  . Glaucoma    bilateral eyes, blindness  . Hepatitis   . History of colorectal cancer   . HIV (human immunodeficiency virus infection) (HCC)   . Hypertension   . Liver nodule    per CT 2020     Review of Systems: Review of Systems  Constitutional: Negative for chills, fever and malaise/fatigue.  Respiratory: Negative for cough and shortness of breath.   Cardiovascular: Positive for leg swelling. Negative for chest pain and palpitations.  Gastrointestinal: Negative for abdominal pain.  Genitourinary: Negative for dysuria and frequency.  Musculoskeletal: Positive for back pain.  Neurological: Negative for sensory change, focal weakness and headaches.  Psychiatric/Behavioral: Negative for depression.     Vitals:   07/05/19 1022  BP: (!) 147/94  Pulse: 71  Temp: 97.9 F (36.6 C)  TempSrc: Oral  SpO2: 100%  Weight: 185 lb 12.8 oz (84.3 kg)  Height: 5\' 11"  (1.803 m)     Physical Exam: Physical Exam  Constitutional: He is oriented to person, place, and time and well-developed, well-nourished, and in no distress.  HENT:  Head: Normocephalic and atraumatic.  Eyes: EOM are normal.  Cardiovascular: Normal rate, regular rhythm, normal heart sounds and intact distal pulses. Exam reveals no gallop and no friction rub.  No murmur heard. Pulmonary/Chest: Effort normal. No respiratory distress. He exhibits no tenderness.  Abdominal: Soft. Bowel sounds are normal. He exhibits no distension.  Musculoskeletal:        General: Edema (nonpitting) present. No tenderness. Normal range of motion.     Cervical back: Normal range of motion.  Neurological: He is  alert and oriented to person, place, and time.  Skin: Skin is warm and dry.     Assessment & Plan:   See Encounters Tab for problem based charting.  Patient discussed with Dr. 

## 2019-07-05 NOTE — Assessment & Plan Note (Signed)
Patient has 2+ nonpitting edema.  His edema is likely secondary to medication side effects and possible chemotherapy.  His kidney function is within normal limits albumin is within normal limits.  His echocardiogram is within normal limits.  And recent imaging shows no signs of steatosis/cirrhosis.  Plan: -Continue elevating legs at night and using compression stockings -We will discontinue amlodipine as it may be worsening his lower extremity edema.

## 2019-07-05 NOTE — Patient Instructions (Signed)
Thank you, Ruben Powell for allowing [place] to provide your care today. Today we discussed Back Pain, Colostomy Care, High Blood pressure.    I have ordered none labs for you. I will call if any are abnormal.    I have place a referrals to Gastroenterology and General Surgery for further evaluation and management.   I have ordered the following tests: none   I have ordered the following medication/changed the following medications:   - Hydrochlorothiazide 12.5 mg daily - Continue Lisinopril 20 mg daily. - Start Naproxen 500 mg nightly for 10 days.   Please follow-up in 3 months.    Should you have any questions or concerns please call the internal medicine clinic at [phone].    Dellia Cloud, D.O. [facility]

## 2019-07-05 NOTE — Assessment & Plan Note (Signed)
No issues noted related to the ostomy, his pain is intermittent.  He does have dry skin around it and will try steroid cream.

## 2019-07-05 NOTE — Assessment & Plan Note (Signed)
Patient's blood pressure is 147/94 today.  Currently using amlodipine 10 and lisinopril 20 mg.  He continues to have significant nonpitting lower extremity edema.  Considering his side effects I will switch patient to hydrochlorothiazide 12.5 mg and continue the lisinopril 20 mg daily.  Plan: - Discontinue Amlodipine and start HCTZ 12.5 mg daily - Continue Lisinopril 20 mg daily.

## 2019-07-05 NOTE — Assessment & Plan Note (Signed)
Hepatitis A and B titers negative so will start the series.  He wants to defer other vaccines to space out the rest.  Will give him pneumovax later here or at his PCP Also asking about Shingrix which we don't have.  He will discuss with his PCP

## 2019-07-05 NOTE — Assessment & Plan Note (Signed)
This problem is new.  It was an incidental finding on a CT scan in December 2020.  His 10-year cardiac vascular risk factor is 25%.  He is not a diabetic nor has he had any cardiovascular conditions otherwise.  We will not start a statin as he just started antiretrovirals and there are frequent interactions.  We will continue to monitor.  PLAN : Monitor

## 2019-07-06 LAB — T-HELPER CELL (CD4) - (RCID CLINIC ONLY)
CD4 % Helper T Cell: 29 % — ABNORMAL LOW (ref 33–65)
CD4 T Cell Abs: 457 /uL (ref 400–1790)

## 2019-07-06 NOTE — Progress Notes (Signed)
Internal Medicine Clinic Attending  Case discussed with Dr. Coe at the time of the visit.  We reviewed the resident's history and exam and pertinent patient test results.  I agree with the assessment, diagnosis, and plan of care documented in the resident's note.    

## 2019-07-09 LAB — HIV-1 RNA QUANT-NO REFLEX-BLD
HIV 1 RNA Quant: <20 copies/mL — AB
HIV-1 RNA Quant, Log: <1.3 Log copies/mL — AB

## 2019-07-26 ENCOUNTER — Other Ambulatory Visit: Payer: Self-pay | Admitting: Internal Medicine

## 2019-08-01 ENCOUNTER — Ambulatory Visit: Payer: Self-pay | Admitting: Surgery

## 2019-08-01 DIAGNOSIS — N493 Fournier gangrene: Secondary | ICD-10-CM

## 2019-08-15 ENCOUNTER — Ambulatory Visit: Payer: Self-pay | Admitting: Surgery

## 2019-08-15 DIAGNOSIS — M4628 Osteomyelitis of vertebra, sacral and sacrococcygeal region: Secondary | ICD-10-CM

## 2019-08-15 DIAGNOSIS — R159 Full incontinence of feces: Secondary | ICD-10-CM | POA: Insufficient documentation

## 2019-08-15 DIAGNOSIS — Z872 Personal history of diseases of the skin and subcutaneous tissue: Secondary | ICD-10-CM | POA: Insufficient documentation

## 2019-08-15 DIAGNOSIS — N493 Fournier gangrene: Secondary | ICD-10-CM

## 2019-09-16 ENCOUNTER — Telehealth: Payer: Self-pay | Admitting: Internal Medicine

## 2019-09-16 NOTE — Telephone Encounter (Signed)
Pls contact regarding [place] [phone]

## 2019-09-16 NOTE — Telephone Encounter (Signed)
Pt's past sig other needs placement in skilled nursing. clarina williams at [phone].

## 2019-09-20 ENCOUNTER — Telehealth: Payer: Self-pay

## 2019-09-20 NOTE — Telephone Encounter (Signed)
Pt's caregiver requesting to speak with a nurse about nursing home for pt. Please call back.

## 2019-09-20 NOTE — Telephone Encounter (Signed)
Thank you for the communication.  Please place an order for CCM.   

## 2019-09-21 ENCOUNTER — Ambulatory Visit: Payer: Medicare Other | Admitting: *Deleted

## 2019-09-21 ENCOUNTER — Ambulatory Visit: Payer: Self-pay

## 2019-09-21 DIAGNOSIS — Z21 Asymptomatic human immunodeficiency virus [HIV] infection status: Secondary | ICD-10-CM

## 2019-09-21 DIAGNOSIS — I10 Essential (primary) hypertension: Secondary | ICD-10-CM

## 2019-09-21 NOTE — Patient Instructions (Signed)
Visit Information  Goals Addressed            This Visit's Progress     Patient Stated   . per caregiver she told BSW that "he needs placement in a nursing home" (pt-stated)       [place] (see longitudinal plan of care for additional care plan information)   Current Barriers:  . Chronic Disease Management support, education, and care coordination needs related to HTN and HIV  Case Manager Clinical Goal(s):  [name] Kitchen Over the next 90 days, patient will work with BSW to address needs related to Level of care concerns in patient with HTN and HIV  Interventions:  . Collaborated with BSW to initiate plan of care to address needs related to Level of care concerns in patient with HTN and HIV  Patient Self Care Activities:  . Is transitioned to facility that can meet patient's care needs  Initial goal documentation        Mr. Roskelley was given information about Chronic Care Management services today including:  1. CCM service includes personalized support from designated clinical staff supervised by his physician, including individualized plan of care and coordination with other care providers 2. 24/7 contact phone numbers for assistance for urgent and routine care needs. 3. Service will only be billed when office clinical staff spend 20 minutes or more in a month to coordinate care. 4. Only one practitioner may furnish and bill the service in a calendar month. 5. The patient may stop CCM services at any time (effective at the end of the month) by phone call to the office staff. 6. The patient will be responsible for cost sharing (co-pay) of up to 20% of the service fee (after annual deductible is met).  Patient's caregiver agreed to services and verbal consent obtained.   The patient's caregiver verbalized understanding of instructions provided today and declined a print copy of patient instruction materials.   The care management team will reach out to the patient again over the  next 7 days.   Kelli Churn RN, CCM, [facility] RN Care Manager [phone]

## 2019-09-21 NOTE — Telephone Encounter (Signed)
Pt's Caregiver calling back stating she has been waiting since last week to hear back from someone.  Please call back.

## 2019-09-21 NOTE — Chronic Care Management (AMB) (Signed)
  Chronic Care Management   Initial Visit Note  09/21/2019 Name: Ruben Powell MRN: [id number] DOB: 1950-11-12  Referred by: Ruben Payment, MD Reason for referral : Chronic Care Management (HTN, HIV)   Ruben Powell is a 69 y.o. year old male who is a primary care patient of Ruben Payment, MD. The CCM team was consulted for assistance with chronic disease management and care coordination needs related to HTN and HIV  Review of patient status, including review of consultants reports, relevant laboratory and other test results, and collaboration with appropriate care team members and the patient's provider was performed as part of comprehensive patient evaluation and provision of chronic care management services.    SDOH (Social Determinants of Health) assessments performed: No See Care Plan activities for detailed interventions related to SDOH     Medications: Outpatient Encounter Medications as of 09/21/2019  Medication Sig  . atropine 1 % ophthalmic solution   . bacitracin ointment Apply to affected area daily  . BIKTARVY 50-200-25 MG TABS tablet TAKE 1 TABLET BY MOUTH DAILY  . hydrochlorothiazide (HYDRODIURIL) 12.5 MG tablet Take 1 tablet (12.5 mg total) by mouth daily.  [name] Kitchen lisinopril (ZESTRIL) 10 MG tablet Take 2 tablets (20 mg total) by mouth daily.   No facility-administered encounter medications on file as of 09/21/2019.     Objective:  BP Readings from Last 3 Encounters:  07/05/19 (!) 147/94  07/05/19 (!) 147/82  06/10/19 138/82    Goals Addressed            This Visit's Progress     Patient Stated   . per caregiver she told BSW that "he needs placement in a nursing home" (pt-stated)       [place] (see longitudinal plan of care for additional care plan information)   Current Barriers:  . Chronic Disease Management support, education, and care coordination needs related to HTN and HIV  Case Manager Clinical Goal(s):  [name] Kitchen Over the next 90 days, patient will  work with BSW to address needs related to Level of care concerns in patient with HTN and HIV  Interventions:  . Collaborated with BSW to initiate plan of care to address needs related to Level of care concerns in patient with HTN and HIV  Patient Self Care Activities:  . Is transitioned to facility that can meet patient's care needs  Initial goal documentation         [name] was given information about Chronic Care Management services today including:  1. CCM service includes personalized support from designated clinical staff supervised by his physician, including individualized plan of care and coordination with other care providers 2. 24/7 contact phone numbers for assistance for urgent and routine care needs. 3. Service will only be billed when office clinical staff spend 20 minutes or more in a month to coordinate care. 4. Only one practitioner may furnish and bill the service in a calendar month. 5. The patient may stop CCM services at any time (effective at the end of the month) by phone call to the office staff. 6. The patient will be responsible for cost sharing (co-pay) of up to 20% of the service fee (after annual deductible is met).  Patient's caregiver agreed to services and verbal consent obtained.   Plan:   The care management team will reach out to the patient again over the next 7  days.   Ruben Churn RN, CCM, [facility] RN Care Manager [phone]

## 2019-09-21 NOTE — Chronic Care Management (AMB) (Deleted)
  Chronic Care Management   Outreach Note  09/21/2019 Name: Ruben Powell MRN: [id number] DOB: 1950/07/17  Referred by: Dellia Cloud, MD Reason for referral : Care Coordination (Placement)   An unsuccessful telephone outreach was attempted today. The patient was referred to the case management team for assistance with care management and care coordination.   Follow Up Plan: A HIPPA compliant phone message was left for the patient's caregiver providing contact information and requesting a return call. Will attempt to reach again tomorrow if no return call today.        [name], BSW Embedded Care Coordination Social Worker [facility] [phone]

## 2019-09-22 ENCOUNTER — Ambulatory Visit: Payer: Medicare Other

## 2019-09-22 ENCOUNTER — Other Ambulatory Visit: Payer: Self-pay | Admitting: Internal Medicine

## 2019-09-22 DIAGNOSIS — I10 Essential (primary) hypertension: Secondary | ICD-10-CM

## 2019-09-22 DIAGNOSIS — Z21 Asymptomatic human immunodeficiency virus [HIV] infection status: Secondary | ICD-10-CM

## 2019-09-22 NOTE — Patient Instructions (Signed)
Visit Information  Goals Addressed            This Visit's Progress   . "I do not want to go to a Nursing Home but I will go to Assisted Living" (pt-stated)       CARE PLAN ENTRY (see longtitudinal plan of care for additional care plan information)  Current Barriers:  . Level of care concerns  Clinical Social Work Clinical Goal(s):  [name] Kitchen Over the next 90 days, patient will work with SW to address concerns related to level of care; pursuing ALF  Interventions: . Patient interviewed and appropriate assessments performed . Provided patient with information about process for placement in ALF . Discussed plans with patient for ongoing care management follow up and provided patient with direct contact information for care management team . Advised patient to consider multiple facilities to pursue for placement . Collaborated with primary care provider re: completion of Fl2; inquired if office visit needs to be scheduled . Mailed list of ALF's in the area to patient.   Patient Self Care Activities:  . Patient verbalizes understanding of plan to work with BSW for placement at ALF . Unable to perform ADLs independently . Unable to perform IADLs independently  Initial goal documentation     . 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 COMPLETED: Per Caregiver, "we are trying to get him into a nursing home" (pt-stated)       Current Barriers:  [name] Kitchen Knowledge Barriers related to resources to address Level of care concerns  Case Manager Clinical Goal(s):  [name] Kitchen Over the next 90 days, patient will work with BSW to address needs related to Level of care concerns . Over the next 90 days, BSW will collaborate with RN Care Manager to address care management and care coordination needs  Interventions:    Collaborated with RN Care Manager and caregiver to establish an individualized plan of care   Talked with patient's caregiver about placement process including need for FL 2 and transition to Long Term Care Medicaid  Encouraged caregiver to contact patient's Medicaid caseworker regarding transition to Long term Care Medicaid.    Left message for Velna Hatchet, Admission Coordinator at [facility] home to confirm bed availability.  Caregiver reported that she has spoken to staff person there and was told they have bed available.   Advised caregiver to consider other facilities for placement in case [facility] is not an option   Message to provider regarding need for completion of FL 2.     Patient Self Care Activities:  . Attends all scheduled provider appointments            Patient verbalizes understanding of instructions  provided today.   The care management team will reach out to the patient again over the next two weeks.     Ronn Melena, [place] Coordination Social Worker [place] [phone]

## 2019-09-22 NOTE — Progress Notes (Signed)
Internal Medicine Clinic Resident   I have personally reviewed this encounter including the documentation in this note and/or discussed this patient with the care management provider. I will address any urgent items identified by the care management provider and will communicate my actions to the patient's PCP. I have reviewed the patient's CCM visit with my supervising attending.  Brandon Aashika Carta, MD 09/22/2019    

## 2019-09-22 NOTE — Patient Instructions (Signed)
Visit Information  Goals Addressed            This Visit's Progress   . "Per Caregiver, we are trying to get him into a nursing home" (pt-stated)       Current Barriers:  [name] Kitchen Knowledge Barriers related to resources available for Level of care concerns  Case Manager Clinical Goal(s):  [name] Kitchen Over the next 90 days, patient will work with BSW to address needs related to Level of care concerns.  Seeking placement . Over the next 90 days, BSW will collaborate with RN Care Manager to address care management and care coordination needs  Interventions:  . Collaborated with RN Care Manager and caregiver to establish an individualized plan of care  . Talked with patient's caregiver about placement process including need for FL 2 and transition to Long Term Care Medicaid . Encouraged caregiver to contact patient's Medicaid caseworker regarding transition to Long term Care Medicaid.   . Left message for Velna Hatchet, Admission Coordinator at [facility] home to confirm bed availability.  Caregiver reported that she has spoken to staff person there and was told they have bed available.  . Advised caregiver to consider other facilities for placement in case [facility] is not an option  . Message to provider regarding need for completion of FL 2.   Patient Self Care Activities:  . Attends all scheduled provider appointments  Initial goal documentation        Follow up plan:  Informed Caregiver that Social Worker would conduct follow up call on 09/22/19 due to not speaking directly with patient today.  Will further address level of care concerns with patient at time of call.

## 2019-09-22 NOTE — Chronic Care Management (AMB) (Signed)
Chronic Care Management    Clinical Social Work General Note  09/22/2019 Name: Flem Enderle MRN: [id number] DOB: [birth year]  Athol Bolds is a 69 y.o. year old male who is a primary care patient of Marianna Payment, MD. The CCM was consulted to assist the patient with Level of Care Concerns.   Mr. Zielke was given information about Chronic Care Management services today including:  1. CCM service includes personalized support from designated clinical staff supervised by his physician, including individualized plan of care and coordination with other care providers 2. 24/7 contact phone numbers for assistance for urgent and routine care needs. 3. Service will only be billed when office clinical staff spend 20 minutes or more in a month to coordinate care. 4. Only one practitioner may furnish and bill the service in a calendar month. 5. The patient may stop CCM services at any time (effective at the end of the month) by phone call to the office staff. 6. The patient will be responsible for cost sharing (co-pay) of up to 20% of the service fee (after annual deductible is met).    Review of patient status, including review of consultants reports, relevant laboratory and other test results, and collaboration with appropriate care team members and the patient's provider was performed as part of comprehensive patient evaluation and provision of chronic care management services.    SDOH (Social Determinants of Health) assessments and interventions performed:  No    Outpatient Encounter Medications as of 09/21/2019  Medication Sig  . atropine 1 % ophthalmic solution   . bacitracin ointment Apply to affected area daily  . BIKTARVY 50-200-25 MG TABS tablet TAKE 1 TABLET BY MOUTH DAILY  . hydrochlorothiazide (HYDRODIURIL) 12.5 MG tablet Take 1 tablet (12.5 mg total) by mouth daily.  [name] Kitchen lisinopril (ZESTRIL) 10 MG tablet Take 2 tablets (20 mg total) by mouth daily.   No facility-administered encounter  medications on file as of 09/21/2019.    Goals Addressed            This Visit's Progress   . "Per Caregiver, we are trying to get him into a nursing home" (pt-stated)       Current Barriers:  [name] Kitchen Knowledge Barriers related to resources available for Level of care concerns  Case Manager Clinical Goal(s):  [name] Kitchen Over the next 90 days, patient will work with BSW to address needs related to Level of care concerns.  Seeking placement . Over the next 90 days, BSW will collaborate with RN Care Manager to address care management and care coordination needs  Interventions:  . Collaborated with RN Care Manager and caregiver to establish an individualized plan of care  . Talked with patient's caregiver about placement process including need for FL 2 and transition to [place] Medicaid . Encouraged caregiver to contact patient's Medicaid caseworker regarding transition to Long term Care Medicaid.   . Left message for Freda Munro, Admission Coordinator at [facility] home to confirm bed availability.  Caregiver reported that she has spoken to staff person there and was told they have bed available.  . Advised caregiver to consider other facilities for placement in case [facility] is not an option  . Message to provider regarding need for completion of FL 2.   Patient Self Care Activities:  . Attends all scheduled provider appointments  Initial goal documentation         Follow Up Plan:  Informed Caregiver that Social Worker would conduct follow up call on 09/22/19 due  to not speaking directly with patient today.  Will further address level of care concerns with patient at time of call.           Ronn Melena, [place] Coordination Social Worker [place] [phone]

## 2019-09-22 NOTE — Chronic Care Management (AMB) (Signed)
Care Management   Follow Up Note   09/22/2019 Name: Adom Schoeneck MRN: [id number] DOB: July 13, 1950  Referred by: Dellia Cloud, MD Reason for referral : Care Coordination (ALF)   Taishaun Hoaglin is a 69 y.o. year old male who is a primary care patient of Dellia Cloud, MD. The care management team was consulted for assistance with care management and care coordination needs.    Review of patient status, including review of consultants reports, relevant laboratory and other test results, and collaboration with appropriate care team members and the patient's provider was performed as part of comprehensive patient evaluation and provision of chronic care management services.    SDOH (Social Determinants of Health) assessments performed: No See Care Plan activities for detailed interventions related to [facility])     Advanced Directives: See Care Plan and Vynca application for related entries.   Goals Addressed            This Visit's Progress   . "I do not want to go to a Nursing Home but I will go to Assisted Living" (pt-stated)       CARE PLAN ENTRY (see longtitudinal plan of care for additional care plan information)  Current Barriers:  . Level of care concerns  Clinical Social Work Clinical Goal(s):  [name] Kitchen Over the next 90 days, patient will work with SW to address concerns related to level of care; pursuing ALF  Interventions: . Patient interviewed and appropriate assessments performed . Provided patient with information about process for placement in ALF . Discussed plans with patient for ongoing care management follow up and provided patient with direct contact information for care management team . Advised patient to consider multiple facilities to pursue for placement . Collaborated with primary care provider re: completion of Fl2; inquired if office visit needs to be scheduled . Mailed list of ALF's in the area to patient.   Patient Self Care Activities:  . Patient verbalizes  understanding of plan to work with BSW for placement at ALF . Unable to perform ADLs independently . Unable to perform IADLs independently  Initial goal documentation     . 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 COMPLETED: Per Caregiver, "we are trying to get him into a nursing home" (pt-stated)       Current Barriers:  [name] Kitchen Knowledge Barriers related to resources to address Level of care concerns  Case Manager Clinical Goal(s):  [name] Kitchen Over the next 90 days, patient will work with BSW to address needs related to Level of care concerns . Over the next 90 days, BSW will collaborate with RN Care Manager to address care management and care coordination needs  Interventions:    Collaborated with  RN Care Manager and caregiver to establish an individualized plan of care   Talked with patient's caregiver about placement process including need for FL 2 and transition to [place] Medicaid  Encouraged caregiver to contact patient's Medicaid caseworker regarding transition to Long term Care Medicaid.    Left message for Freda Munro, Admission Coordinator at [facility] home to confirm bed availability.  Caregiver reported that she has spoken to staff person there and was told they  have bed available.   Advised caregiver to consider other facilities for placement in case [facility] is not an option   Message to provider regarding need for completion of FL 2.     Patient Self Care Activities:  . Attends all scheduled provider appointments             The care management team will reach out to the patient again over the next two weeks.      Ronn Melena, [place] Coordination Social Worker [place] [phone]

## 2019-09-23 NOTE — Progress Notes (Signed)
Internal Medicine Clinic Resident   I have personally reviewed this encounter including the documentation in this note and/or discussed this patient with the care management provider. I will address any urgent items identified by the care management provider and will communicate my actions to the patient's PCP. I have reviewed the patient's CCM visit with my supervising attending.  Ladarryl Wrage, MD 09/23/2019    

## 2019-09-26 NOTE — Progress Notes (Signed)
Internal Medicine Clinic Attending  CCM services provided by the care management provider and their documentation were discussed with Dr. Melvin. We reviewed the pertinent findings, urgent action items addressed by the resident and non-urgent items to be addressed by the PCP.  I agree with the assessment, diagnosis, and plan of care documented in the CCM and resident's note.  Carmisha Larusso, MD 09/26/2019 

## 2019-09-28 ENCOUNTER — Ambulatory Visit: Payer: Self-pay

## 2019-09-28 ENCOUNTER — Ambulatory Visit: Payer: Self-pay | Admitting: *Deleted

## 2019-09-28 ENCOUNTER — Ambulatory Visit: Payer: Medicare Other | Admitting: *Deleted

## 2019-09-28 DIAGNOSIS — I10 Essential (primary) hypertension: Secondary | ICD-10-CM

## 2019-09-28 DIAGNOSIS — Z21 Asymptomatic human immunodeficiency virus [HIV] infection status: Secondary | ICD-10-CM

## 2019-09-28 NOTE — Progress Notes (Signed)
Internal Medicine Clinic Resident   I have personally reviewed this encounter including the documentation in this note and/or discussed this patient with the care management provider. I will address any urgent items identified by the care management provider and will communicate my actions to the patient's PCP. I have reviewed the patient's CCM visit with my supervising attending.   Cleora Karnik D Khaiden Segreto, DO 09/28/2019    

## 2019-09-28 NOTE — Progress Notes (Signed)
Internal Medicine Clinic Attending  CCM services provided by the care management provider and their documentation were discussed with Dr. Bloomfield. We reviewed the pertinent findings, urgent action items addressed by the resident and non-urgent items to be addressed by the PCP.  I agree with the assessment, diagnosis, and plan of care documented in the CCM and resident's note.  Manroop Jakubowicz C Alano Blasco, DO 09/28/2019  

## 2019-09-28 NOTE — Patient Instructions (Signed)
Visit Information  Goals Addressed            This Visit's Progress   . COMPLETED: "I do not want to go to a Nursing Home but I will go to Assisted Living" (pt-stated)       CARE PLAN ENTRY (see longtitudinal plan of care for additional care plan information)  Current Barriers:  . Level of care concerns  Clinical Social Work Clinical Goal(s):  [name] Kitchen Over the next 90 days, patient will work with SW to address concerns related to level of care; pursuing ALF  Interventions: . Patient interviewed and appropriate assessments performed . Provided patient with information about process for placement in ALF . Discussed plans with patient for ongoing care management follow up and provided patient with direct contact information for care management team . Advised patient to consider multiple facilities to pursue for placement . Collaborated with primary care provider re: completion of Fl2; inquired if office visit needs to be scheduled . Mailed list of ALF's in the area to patient.   Patient Self Care Activities:  . Patient verbalizes understanding of plan to work with BSW for placement at ALF . Unable to perform ADLs independently . Unable to perform IADLs independently  Initial goal documentation     . "I will go to a nursing home if it can happen faster than assisted living" (pt-stated)       CARE PLAN ENTRY (see longtitudinal plan of care for additional care plan information)  Current Barriers:  . Limited social support . Level of care concerns . Inability to perform ADL's independently . Inability to perform IADL's independently  Clinical Social Work Clinical Goal(s):  [name] Kitchen Over the next 90 days, patient will work with SW to address concerns related to level of care concerns/transition to placement  Interventions: . Provided education to patient/caregiver regarding level of care options. . Educated patient and caregiver about steps to securing placement . Set limits with patient  and caregiver regarding length of time for placement process as both are wanting patient placed "quickly" . Spoke to Unknown Foley, Admissions Counselor with [facility], regarding bed availability and required documentation    Patient Self Care Activities: . Unable to perform ADLs independently . Unable to perform IADLs independently . Unable to manage HTN without caregiver assistance  Initial goal documentation        Patient verbalizes understanding of instructions provided today.   The patient has been provided with contact information for the care management team and has been advised to call with any health related questions or concerns.       [name], BSW Embedded Care Coordination Social Worker [facility] [phone]

## 2019-09-28 NOTE — Progress Notes (Signed)
Internal Medicine Clinic Resident   I have personally reviewed this encounter including the documentation in this note and/or discussed this patient with the care management provider. I will address any urgent items identified by the care management provider and will communicate my actions to the patient's PCP. I have reviewed the patient's CCM visit with my supervising attending.   Miela Desjardin D Sai Zinn, DO 09/28/2019    

## 2019-09-28 NOTE — Chronic Care Management (AMB) (Signed)
  Chronic Care Management   Outreach Note  09/28/2019 Name: Ruben Powell MRN: [id number] DOB: 12-Oct-1950  Referred by: Dellia Cloud, MD Reason for referral : Chronic Care Management (HTN, HIV)   An unsuccessful telephone outreach was attempted today. The patient was referred to the case management team for assistance with care management and care coordination.  Left HIPAA compliant message on mobile number requesting return call.   Follow Up Plan: If patient does not return call, the care management team will reach out to the patient again over the next 7 days.   Cranford Mon RN, CCM, CDCES CCM Clinic RN Care Manager [phone]

## 2019-09-28 NOTE — Chronic Care Management (AMB) (Signed)
Care Management   Follow Up Note   09/28/2019 Name: Ruben Powell MRN: [id number] DOB: September 16, 1950  Referred by: Dellia Cloud, MD Reason for referral : Care Coordination (Placement)   Ruben Powell is a 69 y.o. year old male who is a primary care patient of Dellia Cloud, MD. The care management team was consulted for assistance with care management and care coordination needs.    Review of patient status, including review of consultants reports, relevant laboratory and other test results, and collaboration with appropriate care team members and the patient's provider was performed as part of comprehensive patient evaluation and provision of chronic care management services.    SDOH (Social Determinants of Health) assessments performed: No See Care Plan activities for detailed interventions related to [facility])     Advanced Directives: See Care Plan and Vynca application for related entries.   Goals Addressed            This Visit's Progress   . COMPLETED: "I do not want to go to a Nursing Home but I will go to Assisted Living" (pt-stated)       CARE PLAN ENTRY (see longtitudinal plan of care for additional care plan information)  Current Barriers:  . Level of care concerns  Clinical Social Work Clinical Goal(s):  [name] Kitchen Over the next 90 days, patient will work with SW to address concerns related to level of care; pursuing ALF  Interventions: . Patient interviewed and appropriate assessments performed . Provided patient with information about process for placement in ALF . Discussed plans with patient for ongoing care management follow up and provided patient with direct contact information for care management team . Advised patient to consider multiple facilities to pursue for placement . Collaborated with primary care provider re: completion of Fl2; inquired if office visit needs to be scheduled . Mailed list of ALF's in the area to patient.   Patient Self Care Activities:   . Patient verbalizes understanding of plan to work with BSW for placement at ALF . Unable to perform ADLs independently . Unable to perform IADLs independently  Initial goal documentation     . "I will go to a nursing home if it can happen faster than assisted living" (pt-stated)       CARE PLAN ENTRY (see longtitudinal plan of care for additional care plan information)  Current Barriers:  . Limited social support . Level of care concerns . Inability to perform ADL's independently . Inability to perform IADL's independently  Clinical Social Work Clinical Goal(s):  [name] Kitchen Over the next 90 days, patient will work with SW to address concerns related to level of care concerns/transition to placement  Interventions: . Provided education to patient/caregiver regarding level of care options. . Educated patient and caregiver about steps to securing placement . Set limits with patient and caregiver regarding length of time for placement process as both are wanting patient placed "quickly" . Spoke to Unknown Foley, Admissions Counselor with [facility], regarding bed availability and required documentation    Patient Self Care Activities: . Unable to perform ADLs independently . Unable to perform IADLs independently . Unable to manage HTN without caregiver assistance  Initial goal documentation         The patient has been provided with contact information for the care management team and has been advised to call with any health related questions or concerns.      Ruben Powell, BSW Embedded Care Coordination Social Worker [facility] [phone]

## 2019-09-28 NOTE — Chronic Care Management (AMB) (Signed)
Chronic Care Management   Follow Up Note   09/28/2019 Name: Ruben Powell MRN: [id number] DOB: 24-May-1951  Referred by: Dellia Cloud, MD Reason for referral : Chronic Care Management (HTN, HIV)   Ruben Powell is a 69 y.o. year old male who is a primary care patient of Dellia Cloud, MD. The CCM team was consulted for assistance with chronic disease management and care coordination needs.    Review of patient status, including review of consultants reports, relevant laboratory and other test results, and collaboration with appropriate care team members and the patient's provider was performed as part of comprehensive patient evaluation and provision of chronic care management services.    SDOH (Social Determinants of Health) assessments performed: No See Care Plan activities for detailed interventions related to [facility])     Outpatient Encounter Medications as of 09/28/2019  Medication Sig  . atropine 1 % ophthalmic solution   . bacitracin ointment Apply to affected area daily  . BIKTARVY 50-200-25 MG TABS tablet TAKE 1 TABLET BY MOUTH DAILY  . hydrochlorothiazide (HYDRODIURIL) 12.5 MG tablet Take 1 tablet (12.5 mg total) by mouth daily.  [name] Kitchen lisinopril (ZESTRIL) 10 MG tablet Take 2 tablets (20 mg total) by mouth daily.   No facility-administered encounter medications on file as of 09/28/2019.     Objective:  BP Readings from Last 3 Encounters:  07/05/19 (!) 147/94  07/05/19 (!) 147/82  06/10/19 138/82    Lab Results  Component Value Date   HIV1RNAQUANT <20 DETECTED (A) 07/05/2019   Lab Results  Component Value Date   CD4TCELL 29 (L) 07/05/2019   CD4TABS 457 07/05/2019    Goals Addressed            This Visit's Progress     Patient Stated   . " I think my health problems are doing OK " (pt-stated)       CARE PLAN ENTRY (see longitudinal plan of care for additional care plan information)   Current Barriers:  Knowledge Deficits related to basic understanding of  hypertension pathophysiology and self care management -patient states he takes his medications as directed, his home aide states she takes his blood pressure twice daily and ensures he takes his medicines, the aide reports his average blood pressure readings are 130/80 per the home BP monitor   Case Manager Clinical Goal(s):  [name] Kitchen Over the next 30 days, patient will demonstrate improved health management independence as evidenced by checking blood pressure as directed and notifying PCP if SBP>180 or DBP > 100, taking all medications as prescribe, and adhering to a low sodium diet as discussed.  Interventions:  . Evaluation of current treatment plan related to hypertension self management and patient's adherence to plan as established by provider. . Reviewed medications with patient and discussed importance of compliance . Discussed plans with patient for ongoing care management follow up and provided patient with direct contact information for care management team . Assessed frequency of blood pressure monitoring by personal care aide and reviewed average of readings over the last week. Reviewed blood pressure target and parameters requiring MD notification. . Reviewed scheduled/upcoming provider appointments including: follow up appointments with Amber Chrismon BSW, infectious disease, and primary care provider. Ensured patient has transportation to the in person appointment.s   . UNABLE to independently mange HTN with out personal aide assistance  Initial goal documentation          Plan:   The care management team will reach out to the patient again over the  next 30 days.   Kelli Churn RN, CCM, [facility] RN Care Manager [phone]

## 2019-09-28 NOTE — Patient Instructions (Signed)
Visit Information It was nice speaking with you today. Amber will keep you and your aide updated on the placement process.  Goals Addressed            This Visit's Progress     Patient Stated   . " I think my health problems are doing OK " (pt-stated)       CARE PLAN ENTRY (see longitudinal plan of care for additional care plan information)   Current Barriers:  [name] Kitchen Knowledge Deficits related to basic understanding of hypertension pathophysiology and self care management -  Case Manager Clinical Goal(s):  [name] Kitchen Over the next 30 days, patient will demonstrate improved health management independence as evidenced by checking blood pressure as directed and notifying PCP if SBP>180 or DBP > 100, taking all medications as prescribe, and adhering to a low sodium diet as discussed.  Interventions:  . Evaluation of current treatment plan related to hypertension self management and patient's adherence to plan as established by provider. . Reviewed medications with patient and discussed importance of compliance . Discussed plans with patient for ongoing care management follow up and provided patient with direct contact information for care management team . Assessed frequency of blood pressure monitoring by personal care aide and reviewed average of readings over the last week. Reviewed blood pressure target and parameters requiring MD notification. . Reviewed scheduled/upcoming provider appointments including: follow up appointments with Amber Chrismon BSW, infectious disease, and primary care provider. Ensured patient has transportation to the in person appointment.s   . UNABLE to independently mange HTN with out personal aide assistance  Initial goal documentation         The patient verbalized understanding of instructions provided today and declined a print copy of patient instruction materials.   The care management team will reach out to the patient again over the next 30 days.   Cranford Mon RN, CCM, [facility] Clinic RN Care Manager [phone]

## 2019-09-29 ENCOUNTER — Ambulatory Visit: Payer: Self-pay

## 2019-09-29 ENCOUNTER — Ambulatory Visit: Payer: Self-pay | Admitting: *Deleted

## 2019-09-29 DIAGNOSIS — Z21 Asymptomatic human immunodeficiency virus [HIV] infection status: Secondary | ICD-10-CM

## 2019-09-29 DIAGNOSIS — I10 Essential (primary) hypertension: Secondary | ICD-10-CM

## 2019-09-29 NOTE — Chronic Care Management (AMB) (Signed)
  Care Management   Note  09/29/2019 Name: Ruben Powell MRN: [id number] DOB: 01-05-1951   Phone call to patient's caregiver and message left advising her that patient's amlodipine was discontinued by the primary care provider on 07/05/19 . Ms Mayford Knife stated patient was receiving this medication when medication reconciliation was performed during the initial assessment call by this RNCM on 09/28/19. This discrepancy was noted today by [employer] BSW during FL2 completion. Ms Mayford Knife was encouraged to call this RNCM for questions or concerns.   Plan: Follow up call is scheduled for 10/25/19 at 10:00 am.  Cranford Mon RN, CCM, [facility] CCM Clinic RN Care Manager [phone]

## 2019-09-29 NOTE — Patient Instructions (Signed)
Visit Information  Goals Addressed            This Visit's Progress   . "I will go to a nursing home if it can happen faster than assisted living" (pt-stated)       CARE PLAN ENTRY (see longtitudinal plan of care for additional care plan information)  Current Barriers:  . Limited social support . Level of care concerns . Inability to perform ADL's independently . Inability to perform IADL's independently  Clinical Social Work Clinical Goal(s):  [name] Kitchen Over the next 90 days, patient will work with SW to address concerns related to level of care concerns/transition to placement  Interventions: . Completed FL 2 . In basket message to provider requesting signature on FL 2 and medication list . Will fax documentation to [facility] once completed.    Patient Self Care Activities: . Unable to perform ADLs independently . Unable to perform IADLs independently . Unable to manage HTN without caregiver assistance  Please see past updates related to this goal by clicking on the "Past Updates" button in the selected goal          Will follow up with patient when documentation is received from provider and sent to [facility]      [name], BSW Embedded Care Coordination Social Worker [facility] [phone]

## 2019-09-29 NOTE — Progress Notes (Signed)
Internal Medicine Clinic Resident   I have personally reviewed this encounter including the documentation in this note and/or discussed this patient with the care management provider. I will address any urgent items identified by the care management provider and will communicate my actions to the patient's PCP. I have reviewed the patient's CCM visit with my supervising attending.  Trace Cederberg D Alda Gaultney, DO 09/29/2019    

## 2019-09-29 NOTE — Chronic Care Management (AMB) (Signed)
  Care Management   Follow Up Note   09/29/2019 Name: Ruben Powell MRN: [id number] DOB: 01/31/1951  Referred by: Dellia Cloud, MD Reason for referral : Care Coordination (Placement)   Ruben Powell is a 69 y.o. year old male who is a primary care patient of Dellia Cloud, MD. The care management team was consulted for assistance with care management and care coordination needs.    Review of patient status, including review of consultants reports, relevant laboratory and other test results, and collaboration with appropriate care team members and the patient's provider was performed as part of comprehensive patient evaluation and provision of chronic care management services.    SDOH (Social Determinants of Health) assessments performed: No See Care Plan activities for detailed interventions related to [facility])     Advanced Directives: See Care Plan and Vynca application for related entries.   Goals Addressed            This Visit's Progress   . "I will go to a nursing home if it can happen faster than assisted living" (pt-stated)       CARE PLAN ENTRY (see longtitudinal plan of care for additional care plan information)  Current Barriers:  . Limited social support . Level of care concerns . Inability to perform ADL's independently . Inability to perform IADL's independently  Clinical Social Work Clinical Goal(s):  [name] Kitchen Over the next 90 days, patient will work with SW to address concerns related to level of care concerns/transition to placement  Interventions: . Completed FL 2 . In basket message to provider requesting signature on FL 2 and medication list . Will fax documentation to [facility] once completed.    Patient Self Care Activities: . Unable to perform ADLs independently . Unable to perform IADLs independently . Unable to manage HTN without caregiver assistance  Please see past updates related to this goal by clicking on the "Past Updates" button in the selected  goal          Follow up call to patient once documentation received from provider and sent to [facility].     [name], BSW Embedded Care Coordination Social Worker [facility] [phone]

## 2019-09-29 NOTE — Addendum Note (Signed)
Addended by: Bary Richard on: 09/29/2019 09:55 AM   Modules accepted: Orders

## 2019-09-29 NOTE — Progress Notes (Signed)
Internal Medicine Clinic Resident   I have personally reviewed this encounter including the documentation in this note and/or discussed this patient with the care management provider. I will address any urgent items identified by the care management provider and will communicate my actions to the patient's PCP. I have reviewed the patient's CCM visit with my supervising attending.  Shailene Demonbreun D Rayshawn Visconti, DO 09/29/2019    

## 2019-09-30 ENCOUNTER — Ambulatory Visit: Payer: Self-pay

## 2019-09-30 DIAGNOSIS — Z21 Asymptomatic human immunodeficiency virus [HIV] infection status: Secondary | ICD-10-CM

## 2019-09-30 DIAGNOSIS — I10 Essential (primary) hypertension: Secondary | ICD-10-CM

## 2019-09-30 NOTE — Progress Notes (Signed)
Internal Medicine Clinic Resident   I have personally reviewed this encounter including the documentation in this note and/or discussed this patient with the care management provider. I will address any urgent items identified by the care management provider and will communicate my actions to the patient's PCP. I have reviewed the patient's CCM visit with my supervising attending.  Wallace Gappa D Shante Maysonet, DO 09/30/2019    

## 2019-09-30 NOTE — Patient Instructions (Signed)
Visit Information  Goals Addressed            This Visit's Progress   . "I will go to a nursing home if it can happen faster than assisted living" (pt-stated)       CARE PLAN ENTRY (see longtitudinal plan of care for additional care plan information)  Current Barriers:  . Limited social support . Level of care concerns . Inability to perform ADL's independently . Inability to perform IADL's independently  Clinical Social Work Clinical Goal(s):  [name] Kitchen Over the next 90 days, patient will work with SW to address concerns related to level of care concerns/transition to placement  Interventions: . Received incoming call from patient and caregiver regarding placement. . Agreed to submit completed FL 2 to requested facilities once received from provider; [facility], [place], [place]. [facility], and [place].   Patient Self Care Activities: . Unable to perform ADLs independently . Unable to perform IADLs independently . Unable to manage HTN without caregiver assistance  Please see past updates related to this goal by clicking on the "Past Updates" button in the selected goal         Patient verbalizes understanding of instructions provided today.   The patient has been provided with contact information for the care management team and has been advised to call with any health related questions or concerns.     [name], BSW Embedded Care Coordination Social Worker [facility] [phone]

## 2019-09-30 NOTE — Chronic Care Management (AMB) (Signed)
  Care Management   Follow Up Note   09/30/2019 Name: Dezmon Conover MRN: [id number] DOB: 1951-02-22  Referred by: Dellia Cloud, MD Reason for referral : Care Coordination (Placement)   Selig Shaheen is a 69 y.o. year old male who is a primary care patient of Dellia Cloud, MD. The care management team was consulted for assistance with care management and care coordination needs.    Review of patient status, including review of consultants reports, relevant laboratory and other test results, and collaboration with appropriate care team members and the patient's provider was performed as part of comprehensive patient evaluation and provision of chronic care management services.    SDOH (Social Determinants of Health) assessments performed: No See Care Plan activities for detailed interventions related to [facility])     Advanced Directives: See Care Plan and Vynca application for related entries.   Goals Addressed            This Visit's Progress   . "I will go to a nursing home if it can happen faster than assisted living" (pt-stated)       CARE PLAN ENTRY (see longtitudinal plan of care for additional care plan information)  Current Barriers:  . Limited social support . Level of care concerns . Inability to perform ADL's independently . Inability to perform IADL's independently  Clinical Social Work Clinical Goal(s):  [name] Kitchen Over the next 90 days, patient will work with SW to address concerns related to level of care concerns/transition to placement  Interventions: . Received incoming call from patient and caregiver regarding placement. . Agreed to submit completed FL 2 to requested facilities once received from provider; [facility], [place], [place]. [facility] [place], and [place].   Patient Self Care Activities: . Unable to perform ADLs independently . Unable to perform IADLs independently . Unable to manage HTN without caregiver assistance  Please see past updates related  to this goal by clicking on the "Past Updates" button in the selected goal          The patient has been provided with contact information for the care management team and has been advised to call with any health related questions or concerns.      [name], BSW Embedded Care Coordination Social Worker [facility] [phone]

## 2019-09-30 NOTE — Progress Notes (Signed)
Internal Medicine Clinic Attending  CCM services provided by the care management provider and their documentation were discussed with Dr. Bloomfield. We reviewed the pertinent findings, urgent action items addressed by the resident and non-urgent items to be addressed by the PCP.  I agree with the assessment, diagnosis, and plan of care documented in the CCM and resident's note.  Grover Woodfield C Tempie Gibeault, DO 09/30/2019  

## 2019-10-03 ENCOUNTER — Emergency Department (HOSPITAL_COMMUNITY)
Admission: EM | Admit: 2019-10-03 | Discharge: 2019-10-04 | Disposition: A | Discharge status: Home / Self Care | Payer: Medicare Other | Attending: Emergency Medicine | Admitting: Emergency Medicine

## 2019-10-03 ENCOUNTER — Encounter (HOSPITAL_COMMUNITY): Payer: Self-pay | Admitting: *Deleted

## 2019-10-03 ENCOUNTER — Ambulatory Visit: Payer: Self-pay

## 2019-10-03 ENCOUNTER — Other Ambulatory Visit: Payer: Self-pay

## 2019-10-03 DIAGNOSIS — Z20822 Contact with and (suspected) exposure to covid-19: Secondary | ICD-10-CM | POA: Diagnosis not present

## 2019-10-03 DIAGNOSIS — I10 Essential (primary) hypertension: Secondary | ICD-10-CM

## 2019-10-03 DIAGNOSIS — Z5900 Homelessness unspecified: Secondary | ICD-10-CM

## 2019-10-03 DIAGNOSIS — Z933 Colostomy status: Secondary | ICD-10-CM | POA: Insufficient documentation

## 2019-10-03 DIAGNOSIS — H543 Unqualified visual loss, both eyes: Secondary | ICD-10-CM | POA: Insufficient documentation

## 2019-10-03 DIAGNOSIS — Z21 Asymptomatic human immunodeficiency virus [HIV] infection status: Secondary | ICD-10-CM

## 2019-10-03 DIAGNOSIS — B20 Human immunodeficiency virus [HIV] disease: Secondary | ICD-10-CM | POA: Diagnosis not present

## 2019-10-03 LAB — URINALYSIS, ROUTINE W REFLEX MICROSCOPIC
Bilirubin Urine: NEGATIVE
Glucose, UA: NEGATIVE mg/dL
Hgb urine dipstick: NEGATIVE
Ketones, ur: NEGATIVE mg/dL
Nitrite: NEGATIVE
Protein, ur: NEGATIVE mg/dL
Specific Gravity, Urine: 1.009 (ref 1.005–1.030)
pH: 5 (ref 5.0–8.0)

## 2019-10-03 LAB — COMPREHENSIVE METABOLIC PANEL
ALT: 37 U/L (ref 0–44)
AST: 46 U/L — ABNORMAL HIGH (ref 15–41)
Albumin: 3.7 g/dL (ref 3.5–5.0)
Alkaline Phosphatase: 130 U/L — ABNORMAL HIGH (ref 38–126)
Anion gap: 7 (ref 5–15)
BUN: 16 mg/dL (ref 8–23)
CO2: 25 mmol/L (ref 22–32)
Calcium: 9.1 mg/dL (ref 8.9–10.3)
Chloride: 105 mmol/L (ref 98–111)
Creatinine, Ser: 1.04 mg/dL (ref 0.61–1.24)
GFR calc Af Amer: >60 mL/min (ref >60)
GFR calc non Af Amer: >60 mL/min (ref >60)
Glucose, Bld: 102 mg/dL — ABNORMAL HIGH (ref 70–99)
Potassium: 4.5 mmol/L (ref 3.5–5.1)
Sodium: 137 mmol/L (ref 135–145)
Total Bilirubin: 1 mg/dL (ref 0.3–1.2)
Total Protein: 8.1 g/dL (ref 6.5–8.1)

## 2019-10-03 LAB — CBC
HCT: 38 % — ABNORMAL LOW (ref 39.0–52.0)
Hemoglobin: 12 g/dL — ABNORMAL LOW (ref 13.0–17.0)
MCH: 30.9 pg (ref 26.0–34.0)
MCHC: 31.6 g/dL (ref 30.0–36.0)
MCV: 97.9 fL (ref 80.0–100.0)
Platelets: 193 10*3/uL (ref 150–400)
RBC: 3.88 MIL/uL — ABNORMAL LOW (ref 4.22–5.81)
RDW: 12.7 % (ref 11.5–15.5)
WBC: 5.3 10*3/uL (ref 4.0–10.5)
nRBC: 0 % (ref 0.0–0.2)

## 2019-10-03 LAB — RESPIRATORY PANEL BY RT PCR (FLU A&B, COVID)
Influenza A by PCR: NEGATIVE
Influenza B by PCR: NEGATIVE
SARS Coronavirus 2 by RT PCR: NEGATIVE

## 2019-10-03 NOTE — Social Work (Addendum)
CSW met with Pt at bedside for further assessment. Pt is A&Ox4. CSW assisted Pt in checking his debit card, it is not valid. Spoke to ex wife who states that she will deliver valid debit card to ED front desk 4/13 in morning.  CSW spoke with Pt about various levels of care and the varieties of payment obligations.

## 2019-10-03 NOTE — Progress Notes (Addendum)
3pm: CSW completed screening to obtain PASSR number, it is [id number].  12pm: CSW spoke with Velna Hatchet at [facility] regarding this patient - Velna Hatchet to Games developer at Internal Medicine to discuss patient's plan further.   CSW spoke with Amber at Internal Medicine regarding this patient. Amber states she the [facility] has not been sent to [facility] due to it not being signed by the patient's PCP yet. CSW and Hospital doctor discussed placement options for patient. Amber states the patient will not be able to afford to pay privately for ALF.   Patient will need to be evaluated by PT for a formal recommendation for the most appropriate level of care.  Edwin Dada, MSW, LCSW-A Transitions of Care  Clinical Social Worker  [facility] Emergency Departments  Medical ICU [phone]

## 2019-10-03 NOTE — Patient Instructions (Signed)
Visit Information  Goals Addressed            This Visit's Progress   . "I will go to a nursing home if it can happen faster than assisted living" (pt-stated)       CARE PLAN ENTRY (see longtitudinal plan of care for additional care plan information)  Current Barriers:  . Limited social support . Level of care concerns . Inability to perform ADL's independently . Inability to perform IADL's independently  Clinical Social Work Clinical Goal(s):  [name] Kitchen Over the next 90 days, patient will work with SW to address concerns related to level of care concerns/transition to placement  Interventions: . Received call from [facility], [place] ,reporting that patient's caregiver contacted clinic and stated that she can no longer care for patient and she planned to drop him off at the clinic. [name] Kitchen Contacted patient's caregiver to discuss current situation.  Caregiver's spouse has been under hospice care and passed away yesterday.  States she can no longer care for patient.  . Directed caregiver to take patient to ED after other potential options were discussed and caregiver stated that no other options for patient's care are available.  Caregiver was adamant that she is no longer going to care for him effective immediately.   [name] Kitchen Collaborated with LCSW, Wandra Mannan, to inform him that caregiver plans to take patient to ED. . Called caregiver back to further discuss patient remaining at home until placement can be secured.  Caregiver not willing to continue providing care while placement is located.  Caregiver/Patient both state no other options for in home care while placement is located.  . Collaborated with Unknown Foley, Admissions Coordinator at [facility].  Medicaid bed is available but patient will need to be assessed for eligibility   . Collaborated with Edwin Dada, ED LCSW-A, regarding patient's situation.  Informed her that FL 2 has been completed but still needs providers signature. Okey Regal stated  that new FL 2 must be completed now that patient is in ED.  She also plans to request PT evaluation .   [name] Kitchen Received call from second shift RN Case Managers Maren Beach and Gilda Crease.  Provided update on specifics of situation with patient.   . Talked with RN Case Managers about PT evaluation.  Does not seem that patient meets eligibility for skilled facility.     Patient Self Care Activities: . Unable to perform ADLs independently . Unable to perform IADLs independently . Unable to manage HTN without caregiver assistance  Please see past updates related to this goal by clicking on the "Past Updates" button in the selected goal          The patient has been provided with contact information for the care management team and has been advised to call with any health related questions or concerns.       [name], BSW Embedded Care Coordination Social Worker [facility] [phone]

## 2019-10-03 NOTE — ED Triage Notes (Signed)
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 No acute distress is noted at triage.

## 2019-10-03 NOTE — ED Provider Notes (Signed)
[place] EMERGENCY DEPARTMENT Provider Note   CSN: [id number] Arrival date & time: 10/03/19  1155     History Chief Complaint  Patient presents with  . Homeless    Ruben Powell is a 69 y.o. male.  Patient with hx hiv, presents indicating he is newly homeless. States had been living with ex-spouse, and that they have kicked him out and he has no place to go. 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  Issue is acute onset, occurred today, but primary care/social work have been aware of the situations for the past 2 weeks - no progress made in obtaining new housing solution during that time. Pt denies acute health issues or new symptoms.   The history is provided by the patient.       Past Medical History:  Diagnosis Date  . Arthritis   . Blind   . CHF (congestive heart failure) ([place])    per CT 2020  . Glaucoma    bilateral eyes, blindness  . Hepatitis   . History of colorectal cancer   . HIV (human immunodeficiency virus infection) ([place])   . Hypertension   . Liver nodule    per CT 2020    Patient Active Problem List   Diagnosis Date Noted  . Fecal incontinence 08/15/2019  . History of decubitus ulcer 08/15/2019  . 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 Need for prophylactic vaccination and inoculation against viral hepatitis 07/05/2019  . Atherosclerosis of aorta ([place]) 07/05/2019  . Lumbar spondylosis 07/05/2019  . Urethral discharge 05/06/2019  . 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 Glaucoma 02/09/2019  . Hypertension 02/09/2019  . HIV (human immunodeficiency virus infection) ([place]) 02/09/2019  . Gout 02/09/2019  . 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 Lower extremity edema 02/09/2019  . Fournier's gangrene in male 2019    Past Surgical History:  Procedure  Laterality Date  . GLAUCOMA SURGERY Bilateral   . LAPAROSCOPIC DIVERTED COLOSTOMY         Family History  Problem Relation Age of Onset  . Cancer Mother        unknown type  . Hypertension Brother   . Cancer Maternal Grandmother        type unknown  . Cancer Maternal Grandfather        type unknown    Social History   Tobacco Use  . Smoking status: Never Smoker  . Smokeless tobacco: Never Used  Substance Use Topics  . Alcohol use: Yes    Comment: occasional beer  . Drug use: Not Currently    Home Medications Prior to Admission medications   Medication Sig Start Date End Date Taking? Authorizing Provider  atropine 1 % ophthalmic solution  07/04/19   [provider]  azelastine (OPTIVAR) 0.05 % ophthalmic solution Apply 1 drop to eye 2 (two) times daily. 09/22/19   [provider]  bacitracin ointment Apply to affected area daily 03/16/19 03/15/20  Lars Mage, MD  BIKTARVY 50-200-25 MG TABS tablet TAKE 1 TABLET BY MOUTH DAILY 07/26/19   Comer, Okey Regal, MD  hydrochlorothiazide (HYDRODIURIL) 12.5 MG tablet Take 1 tablet (12.5 mg total) by mouth daily. 07/05/19   Marianna Payment, MD  lisinopril (ZESTRIL) 10 MG tablet Take 2 tablets (20 mg total) by mouth daily. 07/05/19 10/03/19  Marianna Payment, MD  PATADAY 0.1 % ophthalmic solution SMARTSIG:1 Milliliter(s) In Eye(s) Daily 07/04/19   [provider]    Allergies    Patient has no known allergies.  Review of Systems   Review of Systems  Constitutional: Negative for fever.  HENT: Negative for sore throat.   Eyes: Negative for pain.  Respiratory: Negative for shortness of breath.   Cardiovascular: Negative for chest pain.  Gastrointestinal: Negative for abdominal pain.  Genitourinary: Negative for flank pain.  Musculoskeletal: Negative for neck pain.  Skin: Negative for rash.  Neurological: Negative for headaches.  Hematological: Does not bruise/bleed easily.  Psychiatric/Behavioral: Negative for  confusion.    Physical Exam Updated Vital Signs BP 126/77   Pulse 73   Temp 98.6 F (37 C) (Oral)   Resp 16   Ht 1.803 m (5\' 11" )   Wt 81.6 kg   SpO2 100%   BMI 25.10 kg/m   Physical Exam Vitals and nursing note reviewed.  Constitutional:      Appearance: Normal appearance. He is well-developed.  HENT:     Head: Atraumatic.     Nose: Nose normal.     Mouth/Throat:     Mouth: Mucous membranes are moist.     Pharynx: Oropharynx is clear.  Eyes:     General: No scleral icterus. Neck:     Trachea: No tracheal deviation.  Cardiovascular:     Rate and Rhythm: Normal rate and regular rhythm.     Pulses: Normal pulses.     Heart sounds: Normal heart sounds. No murmur. No friction rub. No gallop.   Pulmonary:     Effort: Pulmonary effort is normal. No accessory muscle usage or respiratory distress.     Breath sounds: Normal breath sounds.  Abdominal:     General: Bowel sounds are normal. There is no distension.     Palpations: Abdomen is soft.     Tenderness: There is no abdominal tenderness.  Genitourinary:    Comments: No cva tenderness. Musculoskeletal:        General: No swelling.     Cervical back: Normal range of motion and neck supple. No rigidity.  Skin:    General: Skin is warm and dry.     Findings: No rash.  Neurological:     Mental Status: He is alert.     Comments: Alert, speech clear.   Psychiatric:        Mood and Affect: Mood normal.     ED Results / Procedures / Treatments   Labs (all labs ordered are listed, but only abnormal results are displayed) Results for orders placed or performed during the hospital encounter of 10/03/19  Respiratory Panel by RT PCR (Flu A&B, Covid) - Nasopharyngeal Swab   Specimen: Nasopharyngeal Swab  Result Value Ref Range   SARS Coronavirus 2 by RT PCR NEGATIVE NEGATIVE   Influenza A by PCR NEGATIVE NEGATIVE   Influenza B by PCR NEGATIVE NEGATIVE  Comprehensive metabolic panel  Result Value Ref Range   Sodium  137 135 - 145 mmol/L   Potassium 4.5 3.5 - 5.1 mmol/L   Chloride 105 98 - 111 mmol/L   CO2 25 22 - 32 mmol/L   Glucose, Bld 102 (H) 70 - 99 mg/dL   BUN 16 8 - 23 mg/dL   Creatinine, Ser 12/03/19 0.61 - 1.24 mg/dL   Calcium 9.1 8.9 - 7.98 mg/dL   Total Protein 8.1 6.5 - 8.1 g/dL   Albumin 3.7 3.5 - 5.0 g/dL   AST 46 (  H) 15 - 41 U/L   ALT 37 0 - 44 U/L   Alkaline Phosphatase 130 (H) 38 - 126 U/L   Total Bilirubin 1.0 0.3 - 1.2 mg/dL   GFR calc non Af Amer >60 >60 mL/min   GFR calc Af Amer >60 >60 mL/min   Anion gap 7 5 - 15  CBC  Result Value Ref Range   WBC 5.3 4.0 - 10.5 K/uL   RBC 3.88 (L) 4.22 - 5.81 MIL/uL   Hemoglobin 12.0 (L) 13.0 - 17.0 g/dL   HCT 45.8 (L) 09.9 - 83.3 %   MCV 97.9 80.0 - 100.0 fL   MCH 30.9 26.0 - 34.0 pg   MCHC 31.6 30.0 - 36.0 g/dL   RDW 82.5 05.3 - 97.6 %   Platelets 193 150 - 400 K/uL   nRBC 0.0 0.0 - 0.2 %  Urinalysis, Routine w reflex microscopic  Result Value Ref Range   Color, Urine STRAW (A) YELLOW   APPearance CLEAR CLEAR   Specific Gravity, Urine 1.009 1.005 - 1.030   pH 5.0 5.0 - 8.0   Glucose, UA NEGATIVE NEGATIVE mg/dL   Hgb urine dipstick NEGATIVE NEGATIVE   Bilirubin Urine NEGATIVE NEGATIVE   Ketones, ur NEGATIVE NEGATIVE mg/dL   Protein, ur NEGATIVE NEGATIVE mg/dL   Nitrite NEGATIVE NEGATIVE   Leukocytes,Ua SMALL (A) NEGATIVE   RBC / HPF 0-5 0 - 5 RBC/hpf   WBC, UA 0-5 0 - 5 WBC/hpf   Bacteria, UA RARE (A) NONE SEEN   Squamous Epithelial / LPF 0-5 0 - 5    EKG None  Radiology No results found.  Procedures Procedures (including critical care time)  Medications Ordered in ED Medications - No data to display  ED Course  I have reviewed the triage vital signs and the nursing notes.  Pertinent labs & imaging results that were available during my care of the patient were reviewed by me and considered in my medical decision making (see chart for details).    MDM Rules/Calculators/A&P                      Stat labs.    Transitions of care team consulted. PT eval obtained - they indicate pt ambulates w steady gait, good strength and mobility, and does not need skilled nursing facility.   Reviewed nursing notes and prior charts for additional history.  Recent pcp visit/social work notes reviewed - no housing solution from that work - and in addition, patient indicates he does not want to go to any type of extended care facility, but rather wants to live in apt or home +/- with home health services.   Labs reviewed/interpreted by me  - chem normal.   Disposition per [facility] team.    Final Clinical Impression(s) / ED Diagnoses Final diagnoses:  None    Rx / DC Orders ED Discharge Orders    None       Cathren Laine, MD 10/03/19 2229

## 2019-10-03 NOTE — Evaluation (Signed)
Physical Therapy Evaluation Patient Details Name: Ruben Powell MRN: [id number] DOB: May 12, 1951 Today's Date: 10/03/2019   History of Present Illness  Patient is a 68 y/o male dropped off at ED by his ex-wife where he has been living , she states can no longer care for him.  Patient is blind and has colostomy which he cannot care for on his own.  Reports had aide 3 days a week, but services expired.  Clinical Impression  Patient presents with mobility at his baseline.  Able to mobilize in familiar environment on his own, but needs a guide in unfamiliar place due to vision loss.  He had a white cane at one time, but it got stolen.  He also reports forgetting education performed back when he lost vision in the 80's for using the phone.  Would benefit from referral to services for the blind for follow up and education.  Patient without skilled PT need at this time.  Will sign off.     Follow Up Recommendations No PT follow up(would benefit from referral to services for the blind for resources for ADL's and community mobility)    Equipment Recommendations  None recommended by PT    Recommendations for Other Services       Precautions / Restrictions Precautions Precautions: Other (comment) Precaution Comments: blind      Mobility  Bed Mobility               General bed mobility comments: NT pt in waiting area in ED  Transfers Overall transfer level: Modified independent Equipment used: None             General transfer comment: stands unaided from chair  Ambulation/Gait Ambulation/Gait assistance: Supervision Gait Distance (Feet): 170 Feet Assistive device: None(pt placed hand on PT shoulder for guidance due to vision loss) Gait Pattern/deviations: Decreased stride length;Shuffle     General Gait Details: patient guided with cues in busy ED waiting area and back to treatment room with pt's hand on PT shoulder; in treatment room able to mobilize short distances wtih  cues only and performed balance tests as noted  Stairs            Wheelchair Mobility    Modified Rankin (Stroke Patients Only)       Balance                                 Standardized Balance Assessment Standardized Balance Assessment : Berg Balance Test Berg Balance Test Sit to Stand: Able to stand  independently using hands Standing Unsupported: Able to stand safely 2 minutes Sitting with Back Unsupported but Feet Supported on Floor or Stool: Able to sit safely and securely 2 minutes Stand to Sit: Sits safely with minimal use of hands Standing Unsupported with Eyes Closed: Able to stand 10 seconds safely Standing Ubsupported with Feet Together: Able to place feet together independently and stand 1 minute safely From Standing Position, Pick up Object from Floor: Able to pick up shoe safely and easily From Standing Position, Turn to Look Behind Over each Shoulder: Turn sideways only but maintains balance Turn 360 Degrees: Able to turn 360 degrees safely but slowly Standing Unsupported, One Foot in Front: Able to plae foot ahead of the other independently and hold 30 seconds         Pertinent Vitals/Pain Pain Assessment: No/denies pain    Home Living Family/patient expects to be discharged to:: Private  residence Living Arrangements: Non-relatives/Friends   Type of Home: Apartment Home Access: Level entry     Home Layout: One [place]: None      Prior Function Level of Independence: Needs assistance   Gait / Transfers Assistance Needed: walked on his own in familiar environment per pt  ADL's / Homemaking Assistance Needed: assistance for colostomy  Comments: denies falls     Hand Dominance        Extremity/Trunk Assessment   Upper Extremity Assessment Upper Extremity Assessment: Overall WFL for tasks assessed;LUE deficits/detail LUE Deficits / Details: limited shoulder elevation compared to R about 110 degrees    Lower  Extremity Assessment Lower Extremity Assessment: Overall WFL for tasks assessed       Communication   Communication: No difficulties  Cognition Arousal/Alertness: Awake/alert Behavior During Therapy: WFL for tasks assessed/performed Overall Cognitive Status: Within Functional Limits for tasks assessed                                 General Comments: states used to know tricks for calling numbers on the phone when trained in the 55's, but forgot how      General Comments General comments (skin integrity, edema, etc.): denies falls, reports takes short steps because he feels more comfortable doing so    Exercises     Assessment/Plan    PT Assessment Patent does not need any further PT services  PT Problem List         PT Treatment Interventions      PT Goals (Current goals can be found in the Care Plan section)  Acute Rehab PT Goals PT Goal Formulation: All assessment and education complete, DC therapy    Frequency     Barriers to discharge        Co-evaluation               AM-PAC PT "6 Clicks" Mobility  Outcome Measure Help needed turning from your back to your side while in a flat bed without using bedrails?: None Help needed moving from lying on your back to sitting on the side of a flat bed without using bedrails?: None Help needed moving to and from a bed to a chair (including a wheelchair)?: None Help needed standing up from a chair using your arms (e.g., wheelchair or bedside chair)?: None Help needed to walk in hospital room?: A Little Help needed climbing 3-5 steps with a railing? : A Little 6 Click Score: 22    End of Session   Activity Tolerance: Patient tolerated treatment well Patient left: in chair(in waiting area in ED)   PT Visit Diagnosis: Difficulty in walking, not elsewhere classified (R26.2)    Time: 4132-4401 PT Time Calculation (min) (ACUTE ONLY): 24 min   Charges:   PT Evaluation $PT Eval Low Complexity: 1  Low PT Treatments $Neuromuscular Re-education: 8-22 mins        Ruben Powell, PT Acute Rehabilitation Services [phone] 10/03/2019   Ruben Powell 10/03/2019, 4:42 PM

## 2019-10-03 NOTE — ED Notes (Deleted)
TTS in process 

## 2019-10-03 NOTE — Chronic Care Management (AMB) (Signed)
Care Management   Follow Up Note   10/03/2019 Name: Ruben Powell MRN: [id number] DOB: 1951/01/15  Referred by: Dellia Cloud, MD Reason for referral : Care Coordination (Placement)   Ruben Powell is a 69 y.o. year old male who is a primary care patient of Dellia Cloud, MD. The care management team was consulted for assistance with care management and care coordination needs.    Review of patient status, including review of consultants reports, relevant laboratory and other test results, and collaboration with appropriate care team members and the patient's provider was performed as part of comprehensive patient evaluation and provision of chronic care management services.    SDOH (Social Determinants of Health) assessments performed: No See Care Plan activities for detailed interventions related to [facility])     Advanced Directives: See Care Plan and Vynca application for related entries.   Goals Addressed            This Visit's Progress   . "I will go to a nursing home if it can happen faster than assisted living" (pt-stated)       CARE PLAN ENTRY (see longtitudinal plan of care for additional care plan information)  Current Barriers:  . Limited social support . Level of care concerns . Inability to perform ADL's independently . Inability to perform IADL's independently  Clinical Social Work Clinical Goal(s):  [name] Kitchen Over the next 90 days, patient will work with SW to address concerns related to level of care concerns/transition to placement  Interventions: . Received call from [facility], [place] ,reporting that patient's caregiver contacted clinic and stated that she can no longer care for patient and she planned to drop him off at the clinic. [name] Kitchen Contacted patient's caregiver to discuss current situation.  Caregiver's spouse has been under hospice care and passed away yesterday.  States she can no longer care for patient.  . Directed caregiver to take patient to ED after other  potential options were discussed and caregiver stated that no other options for patient's care are available.  Caregiver was adamant that she is no longer going to care for him effective immediately.   [name] Kitchen Collaborated with LCSW, Wandra Mannan, to inform him that caregiver plans to take patient to ED. . Called caregiver back to further discuss patient remaining at home until placement can be secured.  Caregiver not willing to continue providing care while placement is located.  Caregiver/Patient both state no other options for in home care while placement is located.  . Collaborated with Unknown Foley, Admissions Coordinator at [facility]: [place].  Medicaid bed is available but patient will need to be assessed for eligibility   . Collaborated with Edwin Dada, ED LCSW-A, regarding patient's situation.  Informed her that FL 2 has been completed but still needs providers signature. Okey Regal stated that new FL 2 must be completed now that patient is in ED.  She also plans to request PT evaluation .   [name] Kitchen Received call from second shift RN Case Managers Maren Beach and Gilda Crease.  Provided update on specifics of situation with patient.   . Talked with RN Case Managers about PT evaluation.  Does not seem that patient meets eligibility for skilled facility.     Patient Self Care Activities: . Unable to perform ADLs independently . Unable to perform IADLs independently . Unable to manage HTN without caregiver assistance  Please see past updates related to this goal by clicking on the "Past Updates" button in the selected goal  The patient has been provided with contact information for the care management team and has been advised to call with any health related questions or concerns.   SIGNATURE

## 2019-10-03 NOTE — TOC Initial Note (Addendum)
Transition of Care [facility]) - Initial/Assessment Note    Patient Details  Name: Ruben Powell MRN: [id number] Date of Birth: 10/11/1950  [facility] CM/SW Contact:    Lockie Pares, RN Phone Number: 10/03/2019, 2:52 PM  Clinical Narrative:                 Patient dropped off at the ED stating they cannot take care of him. Patient is blind.  Seems well cared for, clean. Has colostomy. They have a PCP and care manager at the PCP had started a work up on April 9th, but Fl2 not complete. Patient ambulates with walker and cane at home, but is blind  Needs assistance with ADL'S, cooking, cleaning, and shopping, transportation.  Assisted to bathroom, held my shoulder for navigation, ambulated with steady gait. Explained where all  equipment was.. Assisted back to chair in waiting room.         Patient Goals and CMS Choice  Family wishes placement, Cannot afford ALF      Expected Discharge Plan and Services    PT ordered to evaluate patient for placement                                            Prior Living Arrangements/Services  Lived with family                     Activities of Daily Living      Permission Sought/Granted    Family dropped off patient and left              Emotional Assessment              Admission diagnosis:  SNIF Placement Patient Active Problem List   Diagnosis Date Noted  . Fecal incontinence 08/15/2019  . History of decubitus ulcer 08/15/2019  . 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 Need for prophylactic vaccination and inoculation against viral hepatitis 07/05/2019  . Atherosclerosis of aorta (HCC) 07/05/2019  . Lumbar spondylosis 07/05/2019  . Urethral discharge 05/06/2019  . 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 Glaucoma 02/09/2019  . Hypertension 02/09/2019  . HIV (human immunodeficiency virus infection) (HCC) 02/09/2019  . Gout 02/09/2019  . Colostomy in place (diverting loop colostomy for  fecal incontience/Fournier's gangrene) 02/09/2019  . Lower extremity edema 02/09/2019  . Fournier's gangrene in male 2019   PCP:  Dellia Cloud, MD Pharmacy:   [facility] [phone] [address]chie Patten, [place] - [address] AT [facility] [address] [place] [address] Phone: [phone] Fax: [phone]  [facility] #[id number] - [place], [place] - [address] AT [facility] OF [address] [place] [address] Phone: [phone] Fax: [phone]     Social Determinants of Health (SDOH) Interventions    Readmission Risk Interventions No flowsheet data found.

## 2019-10-03 NOTE — NC FL2 (Signed)
[place] LEVEL OF CARE SCREENING TOOL     IDENTIFICATION  Patient Name: Ruben Powell Birthdate: 05-07-1951 Sex: male Admission Date (Current Location): 10/03/2019  [place] and [place] Number:  Kathleen Argue [id number] [place] and Address:  [facility] [place], [place] [address] [place], [place] [address]      Provider Number: [id number]  Attending Physician Name and Address:  Lajean Saver, MD  Relative Name and Phone Number:  Joe L. Yellowhair [phone]    Current Level of Care: Hospital Recommended Level of Care: [place] Prior Approval Number:    Date Approved/Denied:   PASRR Number: [id number].  Discharge Plan: SNF    Current Diagnoses: Patient Active Problem List   Diagnosis Date Noted  . Fecal incontinence 08/15/2019  . History of decubitus ulcer 08/15/2019  . 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 Need for prophylactic vaccination and inoculation against viral hepatitis 07/05/2019  . Atherosclerosis of aorta ([place]) 07/05/2019  . Lumbar spondylosis 07/05/2019  . Urethral discharge 05/06/2019  . Eosinophilic folliculitis 82/99/3716  . Glaucoma 02/09/2019  . Hypertension 02/09/2019  . HIV (human immunodeficiency virus infection) ([place]) 02/09/2019  . Gout 02/09/2019  . 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 Lower extremity edema 02/09/2019  . Fournier's gangrene in male 2019    Orientation RESPIRATION BLADDER Height & Weight     Self, Time, Situation, Place  Normal Continent Weight: 180 lb (81.6 kg) Height:  5\' 11"  (180.3 cm)  BEHAVIORAL SYMPTOMS/MOOD NEUROLOGICAL BOWEL NUTRITION STATUS      Colostomy Diet(Regular)  AMBULATORY STATUS COMMUNICATION OF NEEDS Skin   Independent Verbally Normal                       Personal Care Assistance Level of Assistance  Bathing, Feeding, Dressing Bathing Assistance: Limited  assistance Feeding assistance: Limited assistance Dressing Assistance: Limited assistance     Functional Limitations Info  Sight, Hearing, Speech Sight Info: Impaired(Pt is totally blind) Hearing Info: Adequate Speech Info: Adequate    SPECIAL CARE FACTORS FREQUENCY  PT (By licensed PT), OT (By licensed OT)     PT Frequency: 5x weekly OT Frequency: 5x weekly            Contractures Contractures Info: Not present    Additional Factors Info  Code Status Code Status Info: Full             Current Medications (10/03/2019):  This is the current hospital active medication list No current facility-administered medications for this encounter.   Current Outpatient Medications  Medication Sig Dispense Refill  . atropine 1 % ophthalmic solution Place 1 drop into both eyes daily.     [name] Kitchen azelastine (OPTIVAR) 0.05 % ophthalmic solution Place 1 drop into both eyes 2 (two) times daily.    [name] Kitchen BIKTARVY 50-200-25 MG TABS tablet TAKE 1 TABLET BY MOUTH DAILY (Patient taking differently: Take 1 tablet by mouth daily. ) 30 tablet 5  . hydrochlorothiazide (HYDRODIURIL) 12.5 MG tablet Take 1 tablet (12.5 mg total) by mouth daily. 30 tablet 2  . lisinopril (ZESTRIL) 10 MG tablet Take 2 tablets (20 mg total) by mouth daily. (Patient taking differently: Take 10 mg by mouth daily. ) 90 tablet 1  . bacitracin ointment Apply to affected area daily (Patient not taking: Reported on 10/03/2019) 30 g 0     Discharge Medications: Please see discharge summary for a list of discharge medications.  Relevant  Imaging Results:  Relevant Lab Results:   Additional Information Pt is legally blind                SSN:[id number]  Aradhana Gin, LCSW

## 2019-10-03 NOTE — ED Notes (Signed)
Social work at bedside-Monique,RN  

## 2019-10-03 NOTE — TOC Initial Note (Signed)
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 Pt was effectively abandoned in ED by family after 1 year in care of ex-wife. Pt and ex-wife's accounts widely vary. Pt is seeking placement in ALF. CSW will fill out FL2.                 Expected Discharge Plan: Assisted Living Barriers to Discharge: Continued Medical Work up   Patient Goals and CMS Choice Patient states their goals for this hospitalization and ongoing recovery are:: Find placement CMS Medicare.gov Compare Post Acute Care list provided to:: Patient Choice offered to / list presented to : Patient  Expected Discharge Plan and Services Expected Discharge Plan: Assisted Living       Living arrangements for the past 2 months: Single Family Home                                      Prior Living Arrangements/Services Living arrangements for the past 2 months: Single Family Home Lives with:: Self, Other (Comment)(Ex-wife) Patient language and need for interpreter reviewed:: No Do you feel safe going back to the place where you live?: No   Pt has been evicted from ex-wife's home/currently homeless.  Need for Family Participation in Patient Care: No (Comment) Care giver support system in place?: No (comment) Current home services: Other (comment)(Ex-wife acting as paid PCA) Criminal Activity/Legal Involvement Pertinent to Current Situation/Hospitalization: No - Comment as needed  Activities of Daily Living      Permission Sought/Granted Permission sought to share information with : Other (comment)(Brother Joe L. Lucky Rathke) Permission granted to share information with : Yes, Verbal Permission Granted  Share Information with NAME: Jaysiah Marchetta     Permission granted to share  info w Relationship: Brother  Permission granted to share info w Contact Information: [phone]  Emotional Assessment Appearance:: Appears older than stated age Attitude/Demeanor/Rapport: Engaged Affect (typically observed): Pleasant Orientation: : Oriented to Self, Oriented to Place, Oriented to  Time, Oriented to Situation Alcohol / Substance Use: Not Applicable Psych Involvement: No (comment)  Admission diagnosis:  SNIF Placement Patient Active Problem List   Diagnosis Date Noted  . Fecal incontinence 08/15/2019  . History of decubitus ulcer 08/15/2019  . 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 Need for prophylactic vaccination and inoculation against viral hepatitis 07/05/2019  . Atherosclerosis of aorta (H[facility]) 07/05/2019  . Lumbar spondylosis 07/05/2019  . Urethral discharge 05/06/2019  . 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 Glaucoma 02/09/2019  . Hypertension 02/09/2019  . HIV (human immunodeficiency virus infection) (HCC) 02/09/2019  . Gout 02/09/2019  . 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 Lower extremity edema 02/09/2019  . Fournier's gangrene in male 2019   PCP:  Dellia Cloud, MD Pharmacy:   [facility] [phone] Archie Patten, [place] - [address] AT [facility] OF [address] [place] [address] Phone: [phone] Fax: [phone]  [id number] - [place],  - [address] AT [facility] OF [address] [place] [address] Phone: [phone] Fax: [phone]     Social  Determinants of Health (SDOH) Interventions    Readmission Risk Interventions No flowsheet data found.

## 2019-10-03 NOTE — TOC Progression Note (Signed)
Transition of Care [facility]) - Progression Note    Patient Details  Name: Ruben Powell MRN: [id number] Date of Birth: 1951-05-06  Transition of Care [facility]) CM/SW Contact  Lockie Pares, RN Phone Number: 10/03/2019, 4:01 PM  Clinical Narrative:    Spoke to Family. Was living with his ex-wife  Ex-wife was caring for him, but her husband just died. She cannot take care of him Her mental state is not good right now. . Daughter cannot take care of him, she states he is disrespectful.  They state the social worker Amber told them to come to the ED and have them take over placement.  Called and left a message with Amber to discuss what has been done with placement at IM office     Barriers to Discharge: Homeless with medical needs  Expected Discharge Plan and Services                                                 Social Determinants of Health (SDOH) Interventions    Readmission Risk Interventions No flowsheet data found.

## 2019-10-04 DIAGNOSIS — Z590 Homelessness: Secondary | ICD-10-CM | POA: Diagnosis not present

## 2019-10-04 MED ORDER — LISINOPRIL 10 MG PO TABS
10.0000 mg | ORAL_TABLET | Freq: Every day | ORAL | Status: DC
  Administered 2019-10-04: 10 mg via ORAL
  Filled 2019-10-04: qty 1

## 2019-10-04 MED ORDER — BICTEGRAVIR-EMTRICITAB-TENOFOV 50-200-25 MG PO TABS
1.0000 | ORAL_TABLET | Freq: Every day | ORAL | Status: DC
  Administered 2019-10-04: 1 via ORAL
  Filled 2019-10-04: qty 1

## 2019-10-04 MED ORDER — HYDROCHLOROTHIAZIDE 25 MG PO TABS
12.5000 mg | ORAL_TABLET | Freq: Every day | ORAL | Status: DC
  Administered 2019-10-04: 12.5 mg via ORAL
  Filled 2019-10-04: qty 1

## 2019-10-04 MED ORDER — KETOTIFEN FUMARATE 0.025 % OP SOLN
1.0000 [drp] | Freq: Two times a day (BID) | OPHTHALMIC | Status: DC
  Administered 2019-10-04: 1 [drp] via OPHTHALMIC
  Filled 2019-10-04: qty 5

## 2019-10-04 MED ORDER — ATROPINE SULFATE 1 % OP SOLN
1.0000 [drp] | Freq: Every day | OPHTHALMIC | Status: DC
  Administered 2019-10-04: 1 [drp] via OPHTHALMIC
  Filled 2019-10-04: qty 2

## 2019-10-04 NOTE — ED Notes (Signed)
SW aware pt states he sees Dr Effie Shy, Inf Disease and that he had his COVID vaccine "a couple of months ago". Pt w/colostomy - does not know name brand of the type he uses.

## 2019-10-04 NOTE — Progress Notes (Signed)
Internal Medicine Clinic Resident  I have personally reviewed this encounter including the documentation in this note and/or discussed this patient with the care management provider. I will address any urgent items identified by the care management provider and will communicate my actions to the patient's PCP. I have reviewed the patient's CCM visit with my supervising attending.  Teva Bronkema, MD 10/04/2019    

## 2019-10-04 NOTE — Progress Notes (Signed)
Internal Medicine Clinic Attending  CCM services provided by the care management provider and their documentation were discussed with Dr. Bloomfield. We reviewed the pertinent findings, urgent action items addressed by the resident and non-urgent items to be addressed by the PCP.  I agree with the assessment, diagnosis, and plan of care documented in the CCM and resident's note.  Ronzell Laban C Daijanae Rafalski, DO 10/04/2019  

## 2019-10-04 NOTE — ED Notes (Addendum)
Pt's belongings - colostomy bag supplies and debit/credit card - arrived - taken to pt's room. Pt aware and debit/credit card given to pt as requested. Pt aware waiting for PTAR - PTAR has been called.

## 2019-10-04 NOTE — Progress Notes (Signed)
Internal Medicine Clinic Attending  CCM services provided by the care management provider and their documentation were reviewed with Dr. Jones.  We reviewed the pertinent findings, urgent action items addressed by the resident and non-urgent items to be addressed by the PCP.  I agree with the assessment, diagnosis, and plan of care documented in the CCM and resident's note.  Jemiah Ellenburg N Delno Blaisdell, MD 10/04/2019  

## 2019-10-04 NOTE — ED Notes (Addendum)
Called Clarina, pt's ex-girlfriend, advised she is on her way now from [place] bringing pt's belongings. Pt aware of delay.

## 2019-10-04 NOTE — Progress Notes (Addendum)
2pm: CSW faxed patient's negative COVID results and AVS to [place].  12:40pm: CSW met with patient at bedside to present him with bed offer from [place] - patient is agreeable to discharge to the facility.   CSW will wait for patient's personal items to be delivered before transportation is arranged.   12pm: CSW spoke with patient's ex girlfriend Clarina to request she bring the patient his debit card and colostomy care supplies - Clarina states she will arrive at [facility] shortly with those items as she is coming from [place].  7:45am: CSW spoke with patient at bedside to discuss his discharge plan. Patient reports his ex-girlfriend / the mother of his children dropped him off yesterday because she can't care for him at home anymore. 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 Patient reports he does not feel safe returning to the home he came from due to the significant drug activity going on. Patient reports he receives an SSI check monthly and is willing to forfeit it for placement. Patient reports his family members from [place] are going to come get him next month. Patient is able to provide colostomy care for himself also.  PT evaluation from yesterday afternoon states the patient is able to ambulate safely on his own but would benefit from services for the blind.   CSW will reach out to local facilities in attempt to find patient safe placement.  -CSW spoke with Pamala Hurry at [facility] who states she has no male ALF beds at this time. -CSW spoke with Chantel at [facility] who agreed to review patient's information. -CSW attempted to reach patient's brother Wille Glaser at [phone] without success. - no voicemail option available.  Madilyn Fireman, MSW, LCSW-A Transitions of Care  Clinical Social Worker  [facility] Emergency Departments  Medical ICU [phone]

## 2019-10-04 NOTE — ED Notes (Signed)
Breakfast tray set up for pt as requested.

## 2019-10-04 NOTE — Discharge Instructions (Signed)
It was our pleasure to provide your ER care today - we hope that [facility] is a good place for you to live.   Follow up with your primary care doctor in the next few weeks.   Return to ER if worse, new symptoms, high fevers, new or severe pain, trouble breathing, or other medical emergency.

## 2019-10-06 ENCOUNTER — Ambulatory Visit: Payer: Self-pay

## 2019-10-06 DIAGNOSIS — Z21 Asymptomatic human immunodeficiency virus [HIV] infection status: Secondary | ICD-10-CM

## 2019-10-06 DIAGNOSIS — I10 Essential (primary) hypertension: Secondary | ICD-10-CM

## 2019-10-06 NOTE — Progress Notes (Signed)
Internal Medicine Clinic Attending  CCM services provided by the care management provider and their documentation were discussed with Dr. Bloomfield. We reviewed the pertinent findings, urgent action items addressed by the resident and non-urgent items to be addressed by the PCP.  I agree with the assessment, diagnosis, and plan of care documented in the CCM and resident's note.  Navpreet Szczygiel, MD 10/06/2019  

## 2019-10-06 NOTE — Patient Instructions (Signed)
Visit Information  Goals Addressed            This Visit's Progress   . "I will go to a nursing home if it can happen faster than assisted living" (pt-stated)       CARE PLAN ENTRY (see longtitudinal plan of care for additional care plan information)  Current Barriers:  . Limited social support . Level of care concerns . Inability to perform ADL's independently . Inability to perform IADL's independently  Clinical Social Work Clinical Goal(s):  [name] Kitchen Over the next 90 days, patient will work with SW to address concerns related to level of care concerns/transition to placement  Interventions: . Attempted to follow up with patient post ED discharge.  No answer or option to leave message at home number listed in chart.  Mobile number in chart is patient's former caregiver.  . Left voicemail message for MeLisa Reece Leader Social Worker, requesting call back to collaborate about patient.   Patient Self Care Activities: . Unable to perform ADLs independently . Unable to perform IADLs independently . Unable to manage HTN without caregiver assistance  Please see past updates related to this goal by clicking on the "Past Updates" button in the selected goal           Telephone follow up appointment with care management team member scheduled for:10/11/19     [name], BSW Embedded Care Coordination Social Worker [facility] [phone]

## 2019-10-06 NOTE — Chronic Care Management (AMB) (Signed)
  Care Management   Follow Up Note   10/06/2019 Name: Ruben Powell MRN: [id number] DOB: 16-Feb-1951  Referred by: Dellia Cloud, MD Reason for referral : Care Coordination (Placement/Level of Care)   Ruben Powell is a 69 y.o. year old male who is a primary care patient of Dellia Cloud, MD. The care management team was consulted for assistance with care management and care coordination needs.    Review of patient status, including review of consultants reports, relevant laboratory and other test results, and collaboration with appropriate care team members and the patient's provider was performed as part of comprehensive patient evaluation and provision of chronic care management services.    SDOH (Social Determinants of Health) assessments performed: No See Care Plan activities for detailed interventions related to [facility])     Advanced Directives: See Care Plan and Vynca application for related entries.   Goals Addressed            This Visit's Progress   . "I will go to a nursing home if it can happen faster than assisted living" (pt-stated)       CARE PLAN ENTRY (see longtitudinal plan of care for additional care plan information)  Current Barriers:  . Limited social support . Level of care concerns . Inability to perform ADL's independently . Inability to perform IADL's independently  Clinical Social Work Clinical Goal(s):  [name] Kitchen Over the next 90 days, patient will work with SW to address concerns related to level of care concerns/transition to placement  Interventions: . Attempted to follow up with patient post ED discharge.  No answer or option to leave message at home number listed in chart.  Mobile number in chart is patient's former caregiver.  . Left voicemail message for MeLisa Reece Leader Social Worker, requesting call back to collaborate about patient.   Patient Self Care Activities: . Unable to perform ADLs independently . Unable to perform IADLs  independently . Unable to manage HTN without caregiver assistance  Please see past updates related to this goal by clicking on the "Past Updates" button in the selected goal          Telephone follow up appointment with care management team member scheduled for:10/11/19    [name], BSW Embedded Care Coordination Social Worker [facility] [phone]

## 2019-10-07 ENCOUNTER — Telehealth: Payer: Medicare Other

## 2019-10-07 NOTE — Progress Notes (Signed)
Internal Medicine Clinic Resident  I have personally reviewed this encounter including the documentation in this note and/or discussed this patient with the care management provider. I will address any urgent items identified by the care management provider and will communicate my actions to the patient's PCP. I have reviewed the patient's CCM visit with my supervising attending.  Nicki Furlan, MD 10/07/2019   

## 2019-10-11 ENCOUNTER — Ambulatory Visit: Payer: Medicare Other

## 2019-10-11 DIAGNOSIS — Z21 Asymptomatic human immunodeficiency virus [HIV] infection status: Secondary | ICD-10-CM

## 2019-10-11 DIAGNOSIS — I10 Essential (primary) hypertension: Secondary | ICD-10-CM

## 2019-10-11 NOTE — Chronic Care Management (AMB) (Signed)
  Care Management   Follow Up Note   10/11/2019 Name: Ruben Powell MRN: [id number] DOB: 02/15/1951  Referred by: Dellia Cloud, MD Reason for referral : Care Coordination (Placement)   Ruben Powell is a 69 y.o. year old male who is a primary care patient of Dellia Cloud, MD. The care management team was consulted for assistance with care management and care coordination needs.    Review of patient status, including review of consultants reports, relevant laboratory and other test results, and collaboration with appropriate care team members and the patient's provider was performed as part of comprehensive patient evaluation and provision of chronic care management services.    SDOH (Social Determinants of Health) assessments performed: No See Care Plan activities for detailed interventions related to [facility])     Advanced Directives: See Care Plan and Vynca application for related entries.   Goals Addressed            This Visit's Progress   . "I will go to a nursing home if it can happen faster than assisted living" (pt-stated)       CARE PLAN ENTRY (see longtitudinal plan of care for additional care plan information)  Current Barriers:  . Limited social support . Level of care concerns . Inability to perform ADL's independently . Inability to perform IADL's independently  Clinical Social Work Clinical Goal(s):  [name] Kitchen Over the next 90 days, patient will work with SW to address concerns related to level of care concerns/transition to placement  Interventions: . Called patient at [facility] to discuss long term plan. . Patient does not wish to remain at [facility] and states "I want a place of my own." . Informed patient of need to collaborate with [place] Social Worker, Rosaura Carpenter, regarding patient's current placement and assistance with long term plan.  . Left second message for Ms. Walston requesting call back to collaborate.    Patient Self  Care Activities: . Unable to perform ADLs independently . Unable to perform IADLs independently . Unable to manage HTN without caregiver assistance  Please see past updates related to this goal by clicking on the "Past Updates" button in the selected goal         Will follow up with patient after collaboration with Best boy.    Kemaya Dorner, BSW Embedded Care Coordination Social Worker [facility] [phone]                            g

## 2019-10-11 NOTE — Progress Notes (Signed)
Internal Medicine Clinic Attending  CCM services provided by the care management provider and their documentation were discussed with Dr. Winters. We reviewed the pertinent findings, urgent action items addressed by the resident and non-urgent items to be addressed by the PCP.  I agree with the assessment, diagnosis, and plan of care documented in the CCM and resident's note.  Philipp Callegari, MD 10/11/2019  

## 2019-10-11 NOTE — Progress Notes (Signed)
Internal Medicine Clinic Attending  CCM services provided by the care management provider and their documentation were discussed with Dr. Jones. We reviewed the pertinent findings, urgent action items addressed by the resident and non-urgent items to be addressed by the PCP.  I agree with the assessment, diagnosis, and plan of care documented in the CCM and resident's note.  Viriginia Amendola, MD 10/11/2019  

## 2019-10-11 NOTE — Progress Notes (Signed)
Internal Medicine Clinic Resident   I have personally reviewed this encounter including the documentation in this note and/or discussed this patient with the care management provider. I will address any urgent items identified by the care management provider and will communicate my actions to the patient's PCP. I have reviewed the patient's CCM visit with my supervising attending.  Kourtlynn Trevor, MD 10/11/2019   

## 2019-10-11 NOTE — Patient Instructions (Signed)
Visit Information  Goals Addressed            This Visit's Progress   . "I will go to a nursing home if it can happen faster than assisted living" (pt-stated)       CARE PLAN ENTRY (see longtitudinal plan of care for additional care plan information)  Current Barriers:  . Limited social support . Level of care concerns . Inability to perform ADL's independently . Inability to perform IADL's independently  Clinical Social Work Clinical Goal(s):  [name] Kitchen Over the next 90 days, patient will work with SW to address concerns related to level of care concerns/transition to placement  Interventions: . Called patient at [facility] to discuss long term plan. . Patient does not wish to remain at [place] and states "I want a place of my own." . Informed patient of need to collaborate with [place] Social Worker, Rosaura Carpenter, regarding patient's current placement and assistance with long term plan.  . Left second message for Ms. Walston requesting call back to collaborate.    Patient Self Care Activities: . Unable to perform ADLs independently . Unable to perform IADLs independently . Unable to manage HTN without caregiver assistance  Please see past updates related to this goal by clicking on the "Past Updates" button in the selected goal         Patient verbalizes understanding of instructions provided today.   Will follow up with patient after collaboration with Lacinda Axon Social Worker     [employer], [place] Embedded Care Coordination Social Worker [facility] [phone]

## 2019-10-14 ENCOUNTER — Ambulatory Visit: Payer: Self-pay

## 2019-10-14 NOTE — Chronic Care Management (AMB) (Signed)
  Care Management   Social Work Note  10/14/2019 Name: Riel Hirschman MRN: [id number] DOB: 1951-02-12  Tabius Rood is a 69 y.o. year old male who sees Dellia Cloud, MD for primary care. The Care Management team was consulted for assistance with Level of Care Concerns.   Third message left for Rosaura Carpenter, Designer, fashion/clothing at [employer] [facility].  Attempting to reach her to collaborate regarding long term plan for patient.         [name], BSW Embedded Care Coordination Social Worker [facility] Center [phone]

## 2019-10-16 ENCOUNTER — Other Ambulatory Visit: Payer: Self-pay | Admitting: Internal Medicine

## 2019-10-17 ENCOUNTER — Other Ambulatory Visit: Payer: Self-pay

## 2019-10-17 ENCOUNTER — Other Ambulatory Visit: Payer: Medicare Other

## 2019-10-17 DIAGNOSIS — B20 Human immunodeficiency virus [HIV] disease: Secondary | ICD-10-CM

## 2019-10-18 LAB — T-HELPER CELL (CD4) - (RCID CLINIC ONLY)
CD4 % Helper T Cell: 30 % — ABNORMAL LOW (ref 33–65)
CD4 T Cell Abs: 748 /uL (ref 400–1790)

## 2019-10-19 LAB — HIV-1 RNA QUANT-NO REFLEX-BLD
HIV 1 RNA Quant: <20 copies/mL — AB
HIV-1 RNA Quant, Log: <1.3 Log copies/mL — AB

## 2019-10-21 ENCOUNTER — Ambulatory Visit: Payer: Medicare Other

## 2019-10-21 DIAGNOSIS — Z21 Asymptomatic human immunodeficiency virus [HIV] infection status: Secondary | ICD-10-CM

## 2019-10-21 DIAGNOSIS — I10 Essential (primary) hypertension: Secondary | ICD-10-CM

## 2019-10-21 NOTE — Patient Instructions (Signed)
Visit Information  Goals Addressed            This Visit's Progress   . "I am supposed to move in with my daughter after I finish therapy" (pt-stated)       CARE PLAN ENTRY (see longitudinal plan of care for additional care plan information)  Current Barriers:  . Level of care concerns  Clinical Social Work Clinical Goal(s):  [name] Kitchen Over the next 30 days, patient will work with SW to address concerns related to level of care/living arrangements  Interventions: . Inter-disciplinary care team collaboration (see longitudinal plan of care) . Talked with patient today about plan upon discharge from [facility].  Patient states that current discharge plan is to move in with his daughter in [place].    Patient Self Care Activities:  . Self administers medications as prescribed . Unable to perform ADLs independently  Initial goal documentation     . COMPLETED: "I will go to a nursing home if it can happen faster than assisted living" (pt-stated)       CARE PLAN ENTRY (see longtitudinal plan of care for additional care plan information)  Current Barriers:  . Limited social support . Level of care concerns . Inability to perform ADL's independently . Inability to perform IADL's independently  Clinical Social Work Clinical Goal(s):  [name] Kitchen Over the next 90 days, patient will work with SW to address concerns related to level of care concerns/transition to placement  Interventions: . Called patient at [facility] to discuss long term plan. . Patient does not wish to remain at [facility] and states "I want a place of my own." . Informed patient of need to collaborate with [place] Social Worker, Rosaura Carpenter, regarding patient's current placement and assistance with long term plan.  . Left second message for Ms. Walston requesting call back to collaborate.    Patient Self Care Activities: . Unable to perform ADLs independently . Unable to  perform IADLs independently . Unable to manage HTN without caregiver assistance  Please see past updates related to this goal by clicking on the "Past Updates" button in the selected goal         Patient verbalizes understanding of instructions provided today.   Telephone follow up appointment with care management team member scheduled for:11/02/19     [name], BSW Embedded Care Coordination Social Worker [facility] Medicine Center [phone]

## 2019-10-21 NOTE — Progress Notes (Signed)
Internal Medicine Clinic Resident   I have personally reviewed this encounter including the documentation in this note and/or discussed this patient with the care management provider. I will address any urgent items identified by the care management provider and will communicate my actions to the patient's PCP. I have reviewed the patient's CCM visit with my supervising attending.  Brandon Raydin Bielinski, MD 10/21/2019    

## 2019-10-21 NOTE — Chronic Care Management (AMB) (Signed)
  Care Management   Follow Up Note   10/21/2019 Name: Ruben Powell MRN: [id number] DOB: 1950/11/22  Referred by: Dellia Cloud, MD Reason for referral : Care Coordination (Level of Care)   Ruben Powell is a 69 y.o. year old male who is a primary care patient of Dellia Cloud, MD. The care management team was consulted for assistance with care management and care coordination needs.    Review of patient status, including review of consultants reports, relevant laboratory and other test results, and collaboration with appropriate care team members and the patient's provider was performed as part of comprehensive patient evaluation and provision of chronic care management services.    SDOH (Social Determinants of Health) assessments performed: No See Care Plan activities for detailed interventions related to [facility])     Advanced Directives: See Care Plan and Vynca application for related entries.   Goals Addressed            This Visit's Progress   . "I am supposed to move in with my daughter after I finish therapy" (pt-stated)       CARE PLAN ENTRY (see longitudinal plan of care for additional care plan information)  Current Barriers:  . Level of care concerns  Clinical Social Work Clinical Goal(s):  [name] Kitchen Over the next 30 days, patient will work with SW to address concerns related to level of care/living arrangements  Interventions: . Inter-disciplinary care team collaboration (see longitudinal plan of care) . Talked with patient today about plan upon discharge from [facility].  Patient states that current discharge plan is to move in with his daughter in [place].    Patient Self Care Activities:  . Self administers medications as prescribed . Unable to perform ADLs independently  Initial goal documentation     . COMPLETED: "I will go to a nursing home if it can happen faster than assisted living" (pt-stated)       CARE PLAN ENTRY (see longtitudinal plan of  care for additional care plan information)  Current Barriers:  . Limited social support . Level of care concerns . Inability to perform ADL's independently . Inability to perform IADL's independently  Clinical Social Work Clinical Goal(s):  [name] Kitchen Over the next 90 days, patient will work with SW to address concerns related to level of care concerns/transition to placement  Interventions: . Called patient at [facility] to discuss long term plan. . Patient does not wish to remain at [facility] and states "I want a place of my own." . Informed patient of need to collaborate with [place] Social Worker, Rosaura Carpenter, regarding patient's current placement and assistance with long term plan.  . Left second message for Ms. Walston requesting call back to collaborate.    Patient Self Care Activities: . Unable to perform ADLs independently . Unable to perform IADLs independently . Unable to manage HTN without caregiver assistance  Please see past updates related to this goal by clicking on the "Past Updates" button in the selected goal          Telephone follow up appointment with care management team member scheduled for:11/02/19    [name], BSW Embedded Care Coordination Social Worker [facility] Internal Medicine Center [phone]

## 2019-10-22 NOTE — Progress Notes (Signed)
Internal Medicine Clinic Attending  CCM services provided by the care management provider and their documentation were discussed with Dr. Winfrey. We reviewed the pertinent findings, urgent action items addressed by the resident and non-urgent items to be addressed by the PCP.  I agree with the assessment, diagnosis, and plan of care documented in the CCM and resident's note.  Latavious Bitter, MD 10/22/2019  

## 2019-10-25 ENCOUNTER — Telehealth: Payer: Medicare Other

## 2019-10-26 ENCOUNTER — Encounter: Payer: Self-pay | Admitting: *Deleted

## 2019-10-31 ENCOUNTER — Other Ambulatory Visit: Payer: Self-pay

## 2019-10-31 ENCOUNTER — Ambulatory Visit: Payer: Self-pay | Admitting: *Deleted

## 2019-10-31 ENCOUNTER — Encounter: Payer: Self-pay | Admitting: Internal Medicine

## 2019-10-31 ENCOUNTER — Telehealth: Payer: Medicare Other

## 2019-10-31 ENCOUNTER — Ambulatory Visit (INDEPENDENT_AMBULATORY_CARE_PROVIDER_SITE_OTHER): Payer: Medicare Other | Admitting: Internal Medicine

## 2019-10-31 VITALS — Ht 71.0 in | Wt 190.0 lb

## 2019-10-31 DIAGNOSIS — Z113 Encounter for screening for infections with a predominantly sexual mode of transmission: Secondary | ICD-10-CM

## 2019-10-31 DIAGNOSIS — Z23 Encounter for immunization: Secondary | ICD-10-CM

## 2019-10-31 DIAGNOSIS — Z21 Asymptomatic human immunodeficiency virus [HIV] infection status: Secondary | ICD-10-CM

## 2019-10-31 DIAGNOSIS — I10 Essential (primary) hypertension: Secondary | ICD-10-CM

## 2019-10-31 NOTE — Chronic Care Management (AMB) (Signed)
  Chronic Care Management   Note  10/31/2019 Name: Ruben Powell MRN: [id number] DOB: [birth year]  Patient remains In [facility] (SNF) since 10/04/19.   Follow up plan: The care management team will reach out to the patient again over the next 30-60 days.   At present , no CCM RN needs identified since he is I skilled level of care.  Cranford Mon RN, CCM, CDCES CCM Clinic RN Care Manager [phone]

## 2019-10-31 NOTE — Assessment & Plan Note (Signed)
Doing well, no changes.  rtc 6 months.  

## 2019-10-31 NOTE — Assessment & Plan Note (Signed)
Hepatitis B vaccine #2/2 and Pneumovax today

## 2019-10-31 NOTE — Progress Notes (Signed)
   Subjective:    Patient ID: Ruben Powell, male    DOB: 1951/03/07, 69 y.o.   MRN: [id number]  HPI Here for follow up of HIV He continues on Biktarvy and no missed doses.  CD4 748, viral load < 20.  Here with a caregiver from his facilty.  Denies any new issues.     Review of Systems  Gastrointestinal: Negative for diarrhea and nausea.  Skin: Negative for rash.       Objective:   Physical Exam Constitutional:      Appearance: Normal appearance.  Eyes:     General: No scleral icterus. Cardiovascular:     Rate and Rhythm: Normal rate and regular rhythm.  Pulmonary:     Effort: Pulmonary effort is normal.  Neurological:     Mental Status: He is alert.  Psychiatric:        Mood and Affect: Mood normal.           Assessment & Plan:

## 2019-11-02 ENCOUNTER — Telehealth: Payer: Medicare Other

## 2019-11-03 ENCOUNTER — Encounter: Payer: Medicare Other | Admitting: Internal Medicine

## 2019-11-03 ENCOUNTER — Encounter: Payer: Self-pay | Admitting: Internal Medicine

## 2019-11-03 ENCOUNTER — Ambulatory Visit: Payer: Medicare Other

## 2019-11-03 ENCOUNTER — Encounter: Payer: Self-pay | Admitting: *Deleted

## 2019-11-03 ENCOUNTER — Ambulatory Visit: Payer: Self-pay

## 2019-11-03 NOTE — Chronic Care Management (AMB) (Signed)
  Chronic Care Management   Outreach Note  11/03/2019 Name: Ruben Powell MRN: [id number] DOB: [birth year]  Referred by: Dellia Cloud, MD Reason for referral : Care Coordination (Level of care)   Patient did not show for scheduled office visit today.  Attempted to contact him but no answer and could not leave message due to mailbox being full.  Left message for Rosaura Carpenter, Social Worker at [employer]     [name], BSW Embedded Care Coordination Social Worker [facility] Center [phone]

## 2019-11-03 NOTE — Assessment & Plan Note (Deleted)
Patient's last blood pressure was within normal.  He appears to be well controlled on his current hydrochlorothiazide 12.5 and lisinopril 10 mg daily.  Plan: -Continue current antihypertensive regimen.

## 2019-11-03 NOTE — Assessment & Plan Note (Deleted)
Patient recently stopped General surgery for consultation regarding colostomy takedown.  General surgery believes patient is not a good candidate for colostomy takedown.  They recommend continuing diverting ostomy indefinitely.  Patient was also referred to the gastroenterologist due to a concern for past history of colorectal cancer, although we have no documentation of this cancer.  Plan: -I will continue to prescribe colostomy supplies for the patient.

## 2019-11-08 ENCOUNTER — Ambulatory Visit: Payer: Medicare Other

## 2019-11-08 DIAGNOSIS — I10 Essential (primary) hypertension: Secondary | ICD-10-CM

## 2019-11-08 DIAGNOSIS — Z21 Asymptomatic human immunodeficiency virus [HIV] infection status: Secondary | ICD-10-CM

## 2019-11-08 NOTE — Progress Notes (Signed)
Internal Medicine Clinic Resident  I have personally reviewed this encounter including the documentation in this note and/or discussed this patient with the care management provider. I will address any urgent items identified by the care management provider and will communicate my actions to the patient's PCP. I have reviewed the patient's CCM visit with my supervising attending, Dr Raines.  Ellery Meroney, MD  Internal Medicine, PGY-1 11/08/2019    

## 2019-11-08 NOTE — Chronic Care Management (AMB) (Signed)
  Care Management   Follow Up Note   11/08/2019 Name: Ruben Powell MRN: [id number] DOB: 17-Jul-1950  Referred by: Dellia Cloud, MD Reason for referral : No chief complaint on file.   Ruben Powell is a 69 y.o. year old male who is a primary care patient of Dellia Cloud, MD. The care management team was consulted for assistance with care management and care coordination needs.    Review of patient status, including review of consultants reports, relevant laboratory and other test results, and collaboration with appropriate care team members and the patient's provider was performed as part of comprehensive patient evaluation and provision of chronic care management services.    SDOH (Social Determinants of Health) assessments performed: No See Care Plan activities for detailed interventions related to [facility])     Advanced Directives: See Care Plan and Vynca application for related entries.   Goals Addressed            This Visit's Progress   . "I am supposed to move in with my daughter after I finish therapy" (pt-stated)       CARE PLAN ENTRY (see longitudinal plan of care for additional care plan information)  Current Barriers:  . Level of care concerns  Clinical Social Work Clinical Goal(s):  [name] Kitchen Over the next 30 days, patient will work with SW to address concerns related to level of care/living arrangements  Interventions: . Inter-disciplinary care team collaboration (see longitudinal plan of care) . Contacted patient to follow up on plan upon discharge from [place].  Patient states today that plan is still for him to move in with his daughter in [place]  Per patient, he and daughter have a meeting scheduled via phone for 11/10/19.    Patient Self Care Activities:  . Self administers medications as prescribed . Unable to perform ADLs independently  Please see past updates related to this goal by clicking on the "Past Updates" button in the selected goal           Telephone follow up appointment with care management team member scheduled for:11/23/19    [name], BSW Embedded Care Coordination Social Worker [facility] Internal Medicine Center [phone]

## 2019-11-08 NOTE — Patient Instructions (Signed)
Visit Information  Goals Addressed            This Visit's Progress   . "I am supposed to move in with my daughter after I finish therapy" (pt-stated)       CARE PLAN ENTRY (see longitudinal plan of care for additional care plan information)  Current Barriers:  . Level of care concerns  Clinical Social Work Clinical Goal(s):  [name] Kitchen Over the next 30 days, patient will work with SW to address concerns related to level of care/living arrangements  Interventions: . Inter-disciplinary care team collaboration (see longitudinal plan of care) . Contacted patient to follow up on plan upon discharge from [facility].  Patient states today that plan is still for him to move in with his daughter in [place]  Per patient, he and daughter have a meeting scheduled via phone for 11/10/19.    Patient Self Care Activities:  . Self administers medications as prescribed . Unable to perform ADLs independently  Please see past updates related to this goal by clicking on the "Past Updates" button in the selected goal         Patient verbalizes understanding of instructions provided today.   Telephone follow up appointment with care management team member scheduled for:11/23/19    [name], BSW Embedded Care Coordination Social Worker [facility] Internal Medicine Center [phone]

## 2019-11-09 NOTE — Progress Notes (Signed)
Internal Medicine Clinic Attending  CCM services provided by the care management provider and their documentation were reviewed with Dr. Aslam.  We reviewed the pertinent findings, urgent action items addressed by the resident and non-urgent items to be addressed by the PCP.  I agree with the assessment, diagnosis, and plan of care documented in the CCM and resident's note.  Moriah Shawley N Jasleen Riepe, MD 11/09/2019  

## 2019-11-23 ENCOUNTER — Ambulatory Visit: Payer: Medicare Other

## 2019-11-23 DIAGNOSIS — Z21 Asymptomatic human immunodeficiency virus [HIV] infection status: Secondary | ICD-10-CM

## 2019-11-23 DIAGNOSIS — I10 Essential (primary) hypertension: Secondary | ICD-10-CM

## 2019-11-23 NOTE — Patient Instructions (Signed)
Visit Information  Goals Addressed            This Visit's Progress   . COMPLETED: "I am supposed to move in with my daughter after I finish therapy" (pt-stated)       CARE PLAN ENTRY (see longitudinal plan of care for additional care plan information)  Current Barriers:  . Level of care concerns  Clinical Social Work Clinical Goal(s):  [name] Kitchen Over the next 30 days, patient will work with SW to address concerns related to level of care/living arrangements  Interventions: . Inter-disciplinary care team collaboration (see longitudinal plan of care) . Contacted patient to follow up on plan upon discharge from [facility].  Inquired about plan to move in with daughter; patient states that he has been unable to get in touch with her. . Left message for [facility] Social Worker, Rosaura Carpenter, requesting call back for collaboration   Patient Self Care Activities:  . Self administers medications as prescribed . Unable to perform ADLs independently  Please see past updates related to this goal by clicking on the "Past Updates" button in the selected goal      . "I want to move in with a family member when I leave [facility]"  I don't want to be in another facility" (pt-stated)       CARE PLAN ENTRY (see longitudinal plan of care for additional care plan information)  Current Barriers:  . Level of care concerns  Patient remains at [facility] at this time.  Apparently patient has lost contact with daughter whom he was supposed to move in with upon discharge.    Clinical Social Work Clinical Goal(s):  [name] Kitchen Over the next 90 days, patient will work with SW to address concerns related to level of care  Interventions: . Inter-disciplinary care team collaboration (see longitudinal plan of care) . Encouraged patient to communicate with [place] Social Worker, Rosaura Carpenter, regarding discharge planning. . Left message for Ms Berle Mull requesting call back with any updates  that she may have  Patient Self Care Activities:  . Patient verbalizes understanding of plan to work with [employer] for discharge planning  . Unable to perform ADLs independently . Unable to perform IADLs independently  Initial goal documentation        Patient verbalizes understanding of instructions provided today.   The care management team will reach out to the patient again over the next 30 days.     [name], BSW Embedded Care Coordination Social Worker [facility] Internal Medicine Center [phone]

## 2019-11-23 NOTE — Chronic Care Management (AMB) (Signed)
Care Management   Follow Up Note   11/23/2019 Name: Ruben Powell MRN: [id number] DOB: [birth year]  Referred by: Dellia Cloud, MD Reason for referral : Care Coordination (Level of care)   Ruben Powell is a 69 y.o. year old male who is a primary care patient of Dellia Cloud, MD. The care management team was consulted for assistance with care management and care coordination needs.    Review of patient status, including review of consultants reports, relevant laboratory and other test results, and collaboration with appropriate care team members and the patient's provider was performed as part of comprehensive patient evaluation and provision of chronic care management services.    SDOH (Social Determinants of Health) assessments performed: No See Care Plan activities for detailed interventions related to [facility])     Advanced Directives: See Care Plan and Vynca application for related entries.   Goals Addressed            This Visit's Progress    COMPLETED: "I am supposed to move in with my daughter after I finish therapy" (pt-stated)       CARE PLAN ENTRY (see longitudinal plan of care for additional care plan information)  Current Barriers:   Level of care concerns  Clinical Social Work Clinical Goal(s):   Over the next 30 days, patient will work with SW to address concerns related to level of care/living arrangements  Interventions:  Inter-disciplinary care team collaboration (see longitudinal plan of care)  Contacted patient to follow up on plan upon discharge from [place].  Inquired about plan to move in with daughter; patient states that he has been unable to get in touch with her.  Left message for [employer] Social Worker, Rosaura Carpenter, requesting call back for collaboration   Patient Self Care Activities:   Self administers medications as prescribed  Unable to perform ADLs independently  Please see past updates related to this goal by clicking on the  "Past Updates" button in the selected goal       "I want to move in with a family member when I leave [facility]"  I don't want to be in another facility" (pt-stated)       CARE PLAN ENTRY (see longitudinal plan of care for additional care plan information)  Current Barriers:   Level of care concerns  Patient remains at [facility] at this time.  Apparently patient has lost contact with daughter whom he was supposed to move in with upon discharge.    Clinical Social Work Clinical Goal(s):   Over the next 90 days, patient will work with SW to address concerns related to level of care  Interventions:  Inter-disciplinary care team collaboration (see longitudinal plan of care)  Encouraged patient to communicate with [place] Social Worker, Rosaura Carpenter, regarding discharge planning.  Left message for Ms Berle Mull requesting call back with any updates that she may have  Patient Self Care Activities:   Patient verbalizes understanding of plan to work with [employer] for discharge planning   Unable to perform ADLs independently  Unable to perform IADLs independently  Initial goal documentation         The care management team will reach out to the patient again over the next 30 days.        [name], BSW Embedded Care Coordination Social Worker [facility] Internal Medicine Center [phone]

## 2019-11-24 NOTE — Progress Notes (Signed)
Internal Medicine Clinic Resident  I have personally reviewed this encounter including the documentation in this note and/or discussed this patient with the care management provider. I will address any urgent items identified by the care management provider and will communicate my actions to the patient's PCP. I have reviewed the patient's CCM visit with my supervising attending, Dr Butcher.  Kirstein Baxley, MD 11/24/2019    

## 2019-11-29 ENCOUNTER — Ambulatory Visit: Payer: Self-pay

## 2019-11-29 ENCOUNTER — Telehealth: Payer: Medicare Other

## 2019-11-29 ENCOUNTER — Ambulatory Visit: Payer: Self-pay | Admitting: *Deleted

## 2019-11-29 DIAGNOSIS — Z21 Asymptomatic human immunodeficiency virus [HIV] infection status: Secondary | ICD-10-CM

## 2019-11-29 DIAGNOSIS — I10 Essential (primary) hypertension: Secondary | ICD-10-CM

## 2019-11-29 NOTE — Progress Notes (Signed)
Internal Medicine Clinic Resident  I have personally reviewed this encounter including the documentation in this note and/or discussed this patient with the care management provider. I will address any urgent items identified by the care management provider and will communicate my actions to the patient's PCP. I have reviewed the patient's CCM visit with my supervising attending, Dr [place].  Cevin Rubinstein M Ellyana Crigler, MD 11/29/2019    

## 2019-11-29 NOTE — Patient Instructions (Signed)
Visit Information  Goals Addressed            This Visit's Progress   . "I want to move in with a family member when I leave [facility]"  I don't want to be in another facility" (pt-stated)       CARE PLAN ENTRY (see longitudinal plan of care for additional care plan information)  Current Barriers:  . Level of care concerns  Patient remains at [facility] at this time.  Apparently patient has lost contact with daughter whom he was supposed to move in with upon discharge.    Clinical Social Work Clinical Goal(s):  [name] Kitchen Over the next 90 days, patient will work with SW to address concerns related to level of care  Interventions: . Received call from [employer], Rosaura Carpenter, regarding long term care plan for patient.  Placement with patient's daughter fell through and patient's brother is unable to accommodate patient in is home at this time.  Patient remains at [facility] and is able to do so until other placement option is located.   Patient Self Care Activities:  . Patient verbalizes understanding of plan to work with [employer] for discharge planning  . Unable to perform ADLs independently . Unable to perform IADLs independently  Please see past updates related to this goal by clicking on the "Past Updates" button in the selected goal           The patient has been provided with contact information for the care management team and has been advised to call with any health related questions or concerns.     [name], BSW Embedded Care Coordination Social Worker [facility] Internal Medicine Center [phone]

## 2019-11-29 NOTE — Chronic Care Management (AMB) (Signed)
  Care Management   Follow Up Note   11/29/2019 Name: Ruben Powell MRN: [id number] DOB: July 31, 1950  Referred by: Dellia Cloud, MD Reason for referral : Care Coordination (Placement)   Ruben Powell is a 69 y.o. year old male who is a primary care patient of Dellia Cloud, MD. The care management team was consulted for assistance with care management and care coordination needs.    Review of patient status, including review of consultants reports, relevant laboratory and other test results, and collaboration with appropriate care team members and the patient's provider was performed as part of comprehensive patient evaluation and provision of chronic care management services.    SDOH (Social Determinants of Health) assessments performed: No See Care Plan activities for detailed interventions related to [facility])     Advanced Directives: See Care Plan and Vynca application for related entries.   Goals Addressed            This Visit's Progress   . "I want to move in with a family member when I leave [facility]"  I don't want to be in another facility" (pt-stated)       CARE PLAN ENTRY (see longitudinal plan of care for additional care plan information)  Current Barriers:  . Level of care concerns  Patient remains at [facility] at this time.  Apparently patient has lost contact with daughter whom he was supposed to move in with upon discharge.    Clinical Social Work Clinical Goal(s):  [name] Kitchen Over the next 90 days, patient will work with SW to address concerns related to level of care  Interventions: . Received call from [employer], Rosaura Carpenter, regarding long term care plan for patient.  Placement with patient's daughter fell through and patient's brother is unable to accommodate patient in is home at this time.  Patient remains at [facility] and is able to do so until other placement option is located.   Patient Self Care Activities:  . Patient  verbalizes understanding of plan to work with Best boy for discharge planning  . Unable to perform ADLs independently . Unable to perform IADLs independently  Please see past updates related to this goal by clicking on the "Past Updates" button in the selected goal          The patient has been provided with contact information for the care management team and has been advised to call with any health related questions or concerns.     Ruben Powell, BSW Embedded Care Coordination Social Worker [facility] [phone]

## 2019-11-29 NOTE — Chronic Care Management (AMB) (Signed)
  Chronic Care Management   Follow Up Note   11/29/2019 Name: Ruben Powell MRN: [id number] DOB: [birth year]  Referred by: Dellia Cloud, MD Reason for referral : Chronic Care Management (HTN, HIV)   Ruben Powell is a 69 y.o. year old male who is a primary care patient of Dellia Cloud, MD. The CCM team was consulted for assistance with chronic disease management and care coordination needs.    Review of patient status, including review of consultants reports, relevant laboratory and other test results, and collaboration with appropriate care team members and the patient's provider was performed as part of comprehensive patient evaluation and provision of chronic care management services.    SDOH (Social Determinants of Health) assessments performed: No See Care Plan activities for detailed interventions related to [facility])     Outpatient Encounter Medications as of 11/29/2019  Medication Sig  . atropine 1 % ophthalmic solution Place 1 drop into both eyes daily.   Ruben Powell azelastine (OPTIVAR) 0.05 % ophthalmic solution Place 1 drop into both eyes 2 (two) times daily.  Ruben Powell BIKTARVY 50-200-25 MG TABS tablet TAKE 1 TABLET BY MOUTH DAILY (Patient taking differently: Take 1 tablet by mouth daily. )  . hydrochlorothiazide (HYDRODIURIL) 12.5 MG tablet TAKE 1 TABLET(12.5 MG) BY MOUTH DAILY  . hydrocortisone 2.5 % cream   . hydrOXYzine (ATARAX/VISTARIL) 25 MG tablet Take 25 mg by mouth daily.  Ruben Powell lisinopril (ZESTRIL) 10 MG tablet Take 2 tablets (20 mg total) by mouth daily. (Patient taking differently: Take 10 mg by mouth daily. )   No facility-administered encounter medications on file as of 11/29/2019.       Goals Addressed            This Visit's Progress     Patient Stated   . " I think my health problems are doing OK " (pt-stated)       CARE PLAN ENTRY (see longitudinal plan of care for additional care plan information)   Current Barriers:  Ruben Powell Knowledge Deficits related to basic understanding of  hypertension pathophysiology and self care management - remains in [facility] since 10/03/19  Case Manager Clinical Goal(s):  Ruben Powell Over the next 180 days, patient will have needs meet via placement at appropriate level of care  Interventions:  Ruben Powell Verified with Amber Chrismon that patient remains at SNF   . UNABLE to independently mange HTN with out personal aide assistance  Please see past updates related to this goal by clicking on the "Past Updates" button in the selected goal           Plan:   The care management team will reach out to the patient again over the next 180 days.    Cranford Mon RN, CCM, CDCES CCM Clinic RN Care Manager [phone]

## 2019-12-01 NOTE — Addendum Note (Signed)
Addended by: Neomia Dear on: 12/01/2019 02:11 PM   Modules accepted: Orders

## 2019-12-09 NOTE — Progress Notes (Signed)
Internal Medicine Clinic Attending  CCM services provided by the care management provider and their documentation were discussed with Dr. Krienke. We reviewed the pertinent findings, urgent action items addressed by the resident and non-urgent items to be addressed by the PCP.  I agree with the assessment, diagnosis, and plan of care documented in the CCM and resident's note.  Tonji Elliff C Lovenia Debruler, DO 12/09/2019  

## 2019-12-23 ENCOUNTER — Ambulatory Visit: Payer: Medicare Other

## 2019-12-23 DIAGNOSIS — Z21 Asymptomatic human immunodeficiency virus [HIV] infection status: Secondary | ICD-10-CM

## 2019-12-23 DIAGNOSIS — I10 Essential (primary) hypertension: Secondary | ICD-10-CM

## 2019-12-23 NOTE — Patient Instructions (Signed)
Visit Information  Goals Addressed              This Visit's Progress   .  "I want to move in with a family member when I leave [facility]"  I don't want to be in another facility" (pt-stated)        CARE PLAN ENTRY (see longitudinal plan of care for additional care plan information)  Current Barriers:  . Level of care concerns  Patient remains at [facility] at this time.  Apparently patient has lost contact with daughter whom he was supposed to move in with upon discharge.   . 12/23/19.  Patient remains at [facility] although he still hopes to move in with a family member.  Patient reports today that living with his daughter is no longer an option because she "took sick".  He is hoping to move in with his brother within the next couple of months.  Patient stated that he is appreciative of check in calls from CCM team   Clinical Social Work Clinical Goal(s):  [name] Kitchen Over the next 90 days, patient will work with SW to address concerns related to level of care  Interventions: . Called patient for update on placement status(see current barriers for update)  Patient Self Care Activities:  . Patient verbalizes understanding of plan to work with Best boy for discharge planning  . Unable to perform ADLs independently . Unable to perform IADLs independently  Please see past updates related to this goal by clicking on the "Past Updates" button in the selected goal           The patient has been provided with contact information for the care management team and has been advised to call with any health related questions or concerns.      [name], BSW Embedded Care Coordination Social Worker [facility] [phone]

## 2019-12-23 NOTE — Chronic Care Management (AMB) (Signed)
°  Care Management   Follow Up Note   12/23/2019 Name: Ruben Powell MRN: [id number] DOB: [birth year]  Referred by: Dellia Cloud, MD Reason for referral : Care Coordination (Level of care)   Ruben Powell is a 69 y.o. year old male who is a primary care patient of Dellia Cloud, MD. The care management team was consulted for assistance with care management and care coordination needs.    Review of patient status, including review of consultants reports, relevant laboratory and other test results, and collaboration with appropriate care team members and the patient's provider was performed as part of comprehensive patient evaluation and provision of chronic care management services.    SDOH (Social Determinants of Health) assessments performed: No See Care Plan activities for detailed interventions related to [facility])     Advanced Directives: See Care Plan and Vynca application for related entries.   Goals Addressed              This Visit's Progress     "I want to move in with a family member when I leave [facility]"  I don't want to be in another facility" (pt-stated)        CARE PLAN ENTRY (see longitudinal plan of care for additional care plan information)  Current Barriers:   Level of care concerns  Patient remains at [facility] at this time.  Apparently patient has lost contact with daughter whom he was supposed to move in with upon discharge.    12/23/19.  Patient remains at [facility] although he still hopes to move in with a family member.  Patient reports today that living with his daughter is no longer an option because she "took sick".  He is hoping to move in with his brother within the next couple of months.  Patient stated that he is appreciative of check in calls from CCM team   Clinical Social Work Clinical Goal(s):   Over the next 90 days, patient will work with SW to address concerns related to level of care  Interventions:  Called patient  for update on placement status(see current barriers for update)  Patient Self Care Activities:   Patient verbalizes understanding of plan to work with Best boy for discharge planning   Unable to perform ADLs independently  Unable to perform IADLs independently  Please see past updates related to this goal by clicking on the "Past Updates" button in the selected goal          The patient has been provided with contact information for the care management team and has been advised to call with any health related questions or concerns.        [name], BSW Embedded Care Coordination Social Worker [facility] [phone]

## 2019-12-29 ENCOUNTER — Telehealth: Payer: Medicare Other

## 2020-01-12 ENCOUNTER — Ambulatory Visit: Payer: Medicare Other | Admitting: *Deleted

## 2020-01-12 DIAGNOSIS — I10 Essential (primary) hypertension: Secondary | ICD-10-CM

## 2020-01-12 DIAGNOSIS — Z21 Asymptomatic human immunodeficiency virus [HIV] infection status: Secondary | ICD-10-CM

## 2020-01-12 NOTE — Chronic Care Management (AMB) (Signed)
  Chronic Care Management   Follow Up Note   01/12/2020 Name: Ruben Powell MRN: [id number] DOB: [birth year]  Referred by: Dellia Cloud, MD Reason for referral : Chronic Care Management (HTN, HIV)   Ruben Powell is a 69 y.o. year old male who is a primary care patient of Dellia Cloud, MD. The CCM team was consulted for assistance with chronic disease management and care coordination needs.    Review of patient status, including review of consultants reports, relevant laboratory and other test results, and collaboration with appropriate care team members and the patient's provider was performed as part of comprehensive patient evaluation and provision of chronic care management services.    SDOH (Social Determinants of Health) assessments performed: No See Care Plan activities for detailed interventions related to [facility])     Outpatient Encounter Medications as of 01/12/2020  Medication Sig  . atropine 1 % ophthalmic solution Place 1 drop into both eyes daily.   [name] Kitchen azelastine (OPTIVAR) 0.05 % ophthalmic solution Place 1 drop into both eyes 2 (two) times daily.  [name] Kitchen BIKTARVY 50-200-25 MG TABS tablet TAKE 1 TABLET BY MOUTH DAILY (Patient taking differently: Take 1 tablet by mouth daily. )  . hydrochlorothiazide (HYDRODIURIL) 12.5 MG tablet TAKE 1 TABLET(12.5 MG) BY MOUTH DAILY  . hydrocortisone 2.5 % cream   . hydrOXYzine (ATARAX/VISTARIL) 25 MG tablet Take 25 mg by mouth daily.  [name] Kitchen lisinopril (ZESTRIL) 10 MG tablet Take 2 tablets (20 mg total) by mouth daily. (Patient taking differently: Take 10 mg by mouth daily. )   No facility-administered encounter medications on file as of 01/12/2020.     Objective:  Wt Readings from Last 3 Encounters:  10/31/19 190 lb (86.2 kg)  10/03/19 180 lb (81.6 kg)  07/05/19 185 lb 12.8 oz (84.3 kg)   BP Readings from Last 3 Encounters:  10/04/19 126/82  07/05/19 (!) 147/94  07/05/19 (!) 147/82    Goals Addressed              This Visit's Progress      Patient Stated   .  " I think my health problems are doing OK " (pt-stated)        CARE PLAN ENTRY (see longitudinal plan of care for additional care plan information)   Current Barriers:  [name] Kitchen Knowledge Deficits related to basic understanding of hypertension pathophysiology and self care management - remains in [facility] since 10/04/19  Case Manager Clinical Goal(s):  [name] Kitchen Over the next 180 days, patient will have needs meet via placement at appropriate level of care  Interventions:  . Spoke with receptionist at [facility]- she verifies that patient  remains a resident there in room 306A  . UNABLE to independently mange HTN with out personal aide assistance  Please see past updates related to this goal by clicking on the "Past Updates" button in the selected goal           Plan:   The care management team will reach out to the patient again over the next 60-90 days.    Cranford Mon RN, CCM, CDCES CCM Clinic RN Care Manager [phone]

## 2020-01-13 NOTE — Progress Notes (Signed)
Internal Medicine Clinic Resident  I have personally reviewed this encounter including the documentation in this note and/or discussed this patient with the care management provider. I will address any urgent items identified by the care management provider and will communicate my actions to the patient's PCP. I have reviewed the patient's CCM visit with my supervising attending, Dr Hoffman.  Alesa Echevarria D Hammad Finkler, DO 01/13/2020    

## 2020-01-20 NOTE — Progress Notes (Signed)
Internal Medicine Clinic Attending  CCM services provided by the care management provider and their documentation were discussed with Dr. Bloomfield. We reviewed the pertinent findings, urgent action items addressed by the resident and non-urgent items to be addressed by the PCP.  I agree with the assessment, diagnosis, and plan of care documented in the CCM and resident's note.  Dinh Ayotte C Ahmari Garton, DO 01/20/2020  

## 2020-01-27 ENCOUNTER — Telehealth: Payer: Medicare Other

## 2020-02-22 ENCOUNTER — Other Ambulatory Visit: Payer: Self-pay

## 2020-02-22 ENCOUNTER — Other Ambulatory Visit: Payer: Self-pay | Admitting: Internal Medicine

## 2020-02-22 MED ORDER — BIKTARVY 50-200-25 MG PO TABS: 1.0000 | ORAL_TABLET | Freq: Every day | ORAL | 5 refills | Status: DC

## 2020-02-22 MED ORDER — BIKTARVY 50-200-25 MG PO TABS: 1.0000 | ORAL_TABLET | Freq: Every day | ORAL | 5 refills | Status: AC

## 2020-03-13 ENCOUNTER — Telehealth: Payer: Medicare Other

## 2020-03-13 ENCOUNTER — Telehealth: Payer: Self-pay | Admitting: *Deleted

## 2020-03-13 NOTE — Telephone Encounter (Signed)
  Chronic Care Management   Note  03/13/2020 Name: Ruben Powell MRN: [id number] DOB: 1951/05/29   Attempted to contact [facility] to determine if patient remains a resident there. Phone rang numerous times without an answer and no option to leave message.  Follow up plan: The care management team will reach out to the patient again over the next 7-10 days.   Cranford Mon RN, CCM, CDCES CCM Clinic RN Care Manager [phone]

## 2020-03-14 ENCOUNTER — Ambulatory Visit: Payer: Medicare Other | Admitting: *Deleted

## 2020-03-14 DIAGNOSIS — Z85048 Personal history of other malignant neoplasm of rectum, rectosigmoid junction, and anus: Secondary | ICD-10-CM

## 2020-03-14 DIAGNOSIS — Z21 Asymptomatic human immunodeficiency virus [HIV] infection status: Secondary | ICD-10-CM

## 2020-03-14 DIAGNOSIS — I10 Essential (primary) hypertension: Secondary | ICD-10-CM

## 2020-03-14 DIAGNOSIS — Z433 Encounter for attention to colostomy: Secondary | ICD-10-CM

## 2020-03-14 NOTE — Chronic Care Management (AMB) (Signed)
Chronic Care Management   Follow Up Note   03/14/2020 Name: Ruben Powell MRN: [id number] DOB: 06-05-1951  Referred by: Dellia Cloud, MD Reason for referral : Chronic Care Management (HTN, HIV , Colostomy)   Ruben Powell is a 69 y.o. year old male who is a primary care patient of Dellia Cloud, MD. The CCM team was consulted for assistance with chronic disease management and care coordination needs.    Review of patient status, including review of consultants reports, relevant laboratory and other test results, and collaboration with appropriate care team members and the patient's provider was performed as part of comprehensive patient evaluation and provision of chronic care management services.    SDOH (Social Determinants of Health) assessments performed: No See Care Plan activities for detailed interventions related to [facility])     Outpatient Encounter Medications as of 03/14/2020  Medication Sig  . atropine 1 % ophthalmic solution Place 1 drop into both eyes daily.   [name] Kitchen azelastine (OPTIVAR) 0.05 % ophthalmic solution Place 1 drop into both eyes 2 (two) times daily.  . bictegravir-emtricitabine-tenofovir AF (BIKTARVY) 50-200-25 MG TABS tablet Take 1 tablet by mouth daily.  . hydrochlorothiazide (HYDRODIURIL) 12.5 MG tablet TAKE 1 TABLET(12.5 MG) BY MOUTH DAILY  . hydrocortisone 2.5 % cream   . hydrOXYzine (ATARAX/VISTARIL) 25 MG tablet Take 25 mg by mouth daily.  [name] Kitchen lisinopril (ZESTRIL) 10 MG tablet Take 2 tablets (20 mg total) by mouth daily. (Patient taking differently: Take 10 mg by mouth daily. )   No facility-administered encounter medications on file as of 03/14/2020.     Objective:  BP Readings from Last 3 Encounters:  10/04/19 126/82  07/05/19 (!) 147/94  07/05/19 (!) 147/82   Wt Readings from Last 3 Encounters:  10/31/19 190 lb (86.2 kg)  10/03/19 180 lb (81.6 kg)  07/05/19 185 lb 12.8 oz (84.3 kg)    Goals Addressed              This Visit's Progress      Patient Stated   .  COMPLETED: " I think my health problems are doing OK " (pt-stated)        CARE PLAN ENTRY (see longitudinal plan of care for additional care plan information)   Current Barriers:  [name] Kitchen Knowledge Deficits related to basic understanding of hypertension pathophysiology and self care management - called [facility] and verified that patient remains a resident there since 10/04/19, now in Room 407 A  Case Manager Clinical Goal(s):  [name] Kitchen Over the next 180 days, patient will have needs meet via placement at appropriate level of care  Interventions:  . Spoke with receptionist at [facility]- she verifies that patient  remains a resident there in room 407 A . Collaborated with CCM BSW Amber Chrismon and after discussion will close case and complete all care plans since plan is for patient to reside at the facility indefinitely  . UNABLE to independently mange HTN with out personal aide assistance  Please see past updates related to this goal by clicking on the "Past Updates" button in the selected goal       .  COMPLETED: "I want to move in with a family member when I leave [facility]"  I don't want to be in another facility" (pt-stated)        CARE PLAN ENTRY (see longitudinal plan of care for additional care plan information)  Current Barriers:  . Level of care concerns  Patient remains at [facility] at this time.  Apparently patient has lost contact with daughter whom he was supposed to move in with upon discharge.   . 12/23/19.  Patient remains at [place] although he still hopes to move in with a family member.  Patient reports today that living with his daughter is no longer an option because she "took sick".  He is hoping to move in with his brother within the next couple of months.  Patient stated that he is appreciative of check in calls from CCM team  . 03/14/20- [facility] to  determine if patient is still a resident there  Clinical Social Work Clinical Goal(s):  [name] Kitchen Over the next 90 days, patient will work with SW to address concerns related to level of care  Interventions: . Spoke with receptionist at [facility]- she verifies that patient remains a resident there in room 407 A . Collaborated with CCM BSW Amber Chrismon and after discussion will close case and complete all care plans since plan is for patient to reside at the facility indefinitely  Patient Self Care Activities:  . Patient verbalizes understanding of plan to work with Best boy for discharge planning  . Unable to perform ADLs independently . Unable to perform IADLs independently  Please see past updates related to this goal by clicking on the "Past Updates" button in the selected goal          Plan:   No further follow up required: a patient will reside at SNF indefinitely.   Cranford Mon RN, CCM, CDCES CCM Clinic RN Care Manager [phone]

## 2020-04-02 ENCOUNTER — Ambulatory Visit: Payer: Self-pay

## 2020-04-02 NOTE — Chronic Care Management (AMB) (Signed)
CCM status changed to previously enrolled at patient has been admitted to SNF indefinitely.    [name], BSW Embedded Care Coordination Social Worker [facility] [phone]

## 2020-04-05 ENCOUNTER — Telehealth: Payer: Self-pay | Admitting: *Deleted

## 2020-04-05 NOTE — Telephone Encounter (Signed)
Received call from [place] at [facility].  Patient's family is  Moving him today from the facility in [place] closer to them in [place].  Brandy asked how to transfer his HIV care.  RN asked her to look for "[facility]" Facilities near his new address.  RN faxed ROI for patient signature and new RW clinic contact info.  Asked her to have the new clinic call [facility] and ask for Marcelino Duster to help facilitate transfer of care. Upcoming appointment cancelled, [facility] notified. Andree Coss, RN

## 2020-05-02 ENCOUNTER — Ambulatory Visit: Payer: Medicare Other | Admitting: Internal Medicine

## 2021-07-04 IMAGING — CR PORTABLE CHEST - 1 VIEW
1 series · 2 of 2 positions shown · non-contrast
Comparison: None.

CLINICAL DATA: Lower extremity swelling history of hypertension,
HIV

EXAM:
PORTABLE CHEST 1 VIEW

[Series 1: portable · 0.17mm/px · 2 of 2 slices shown]
[im 1/2]
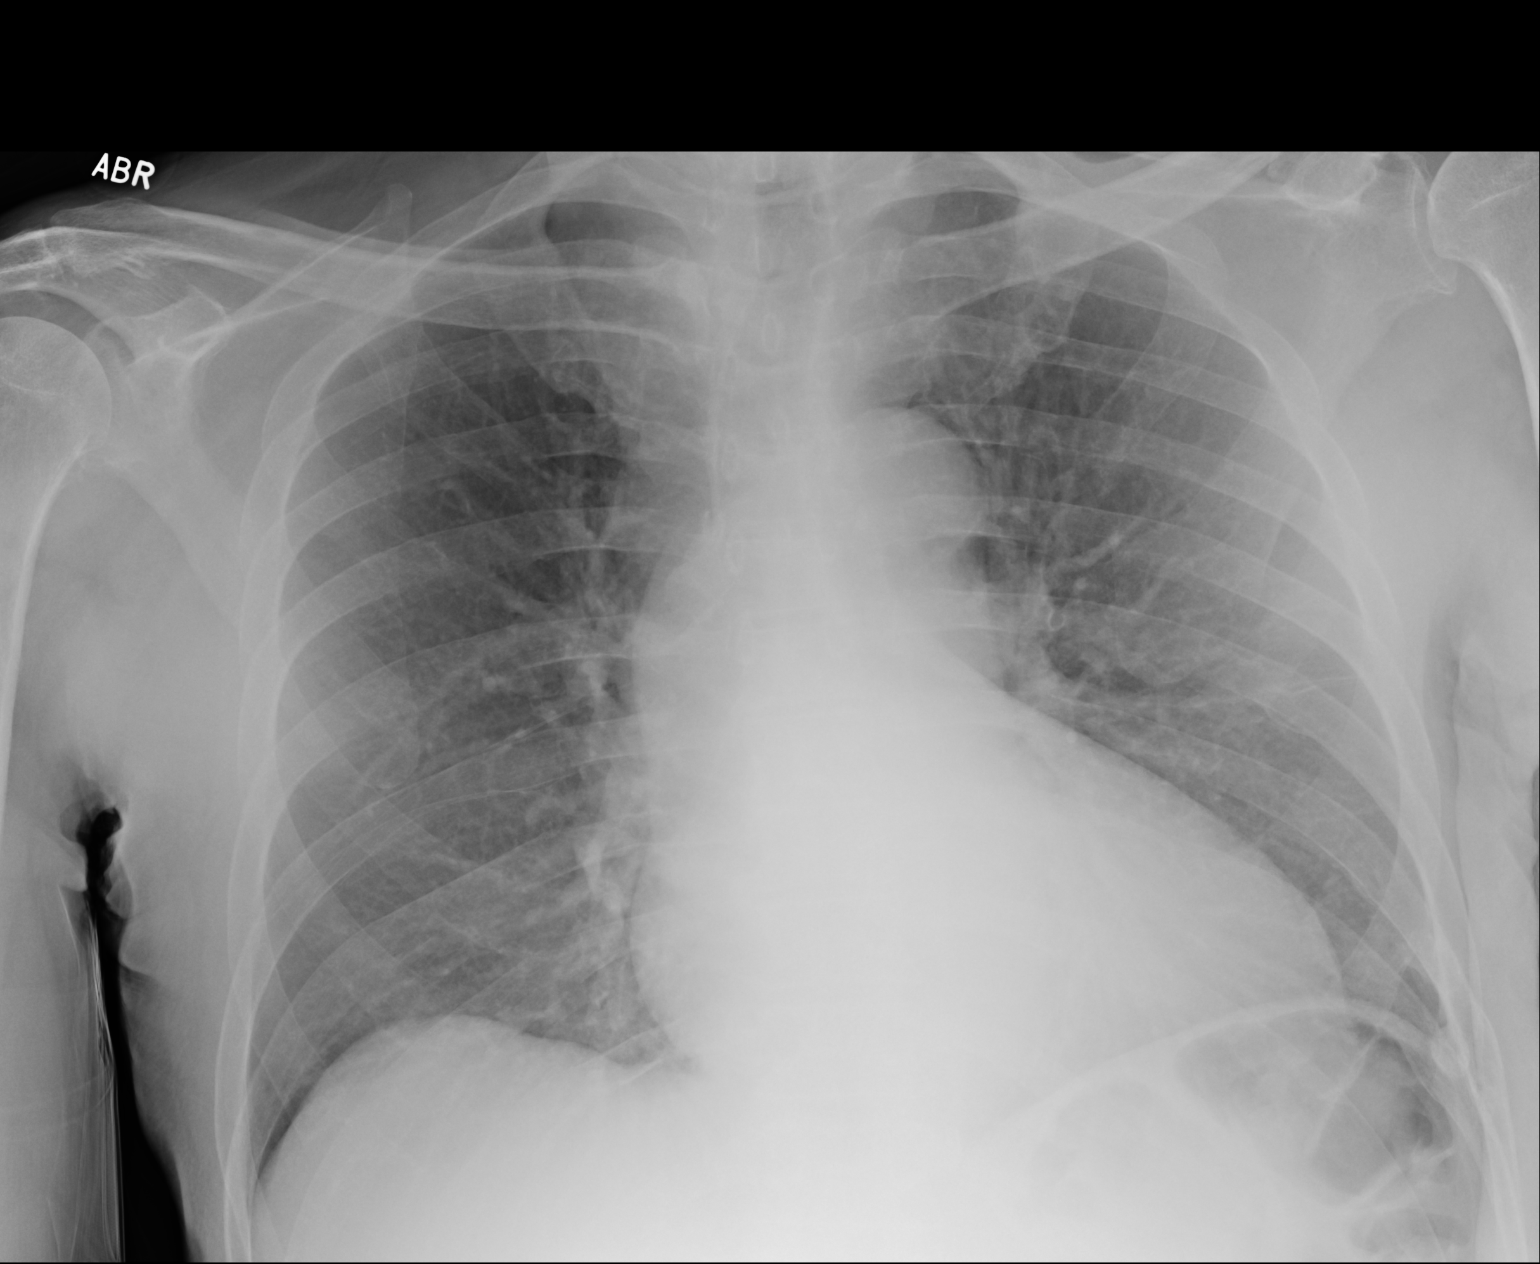
[im 2/2]
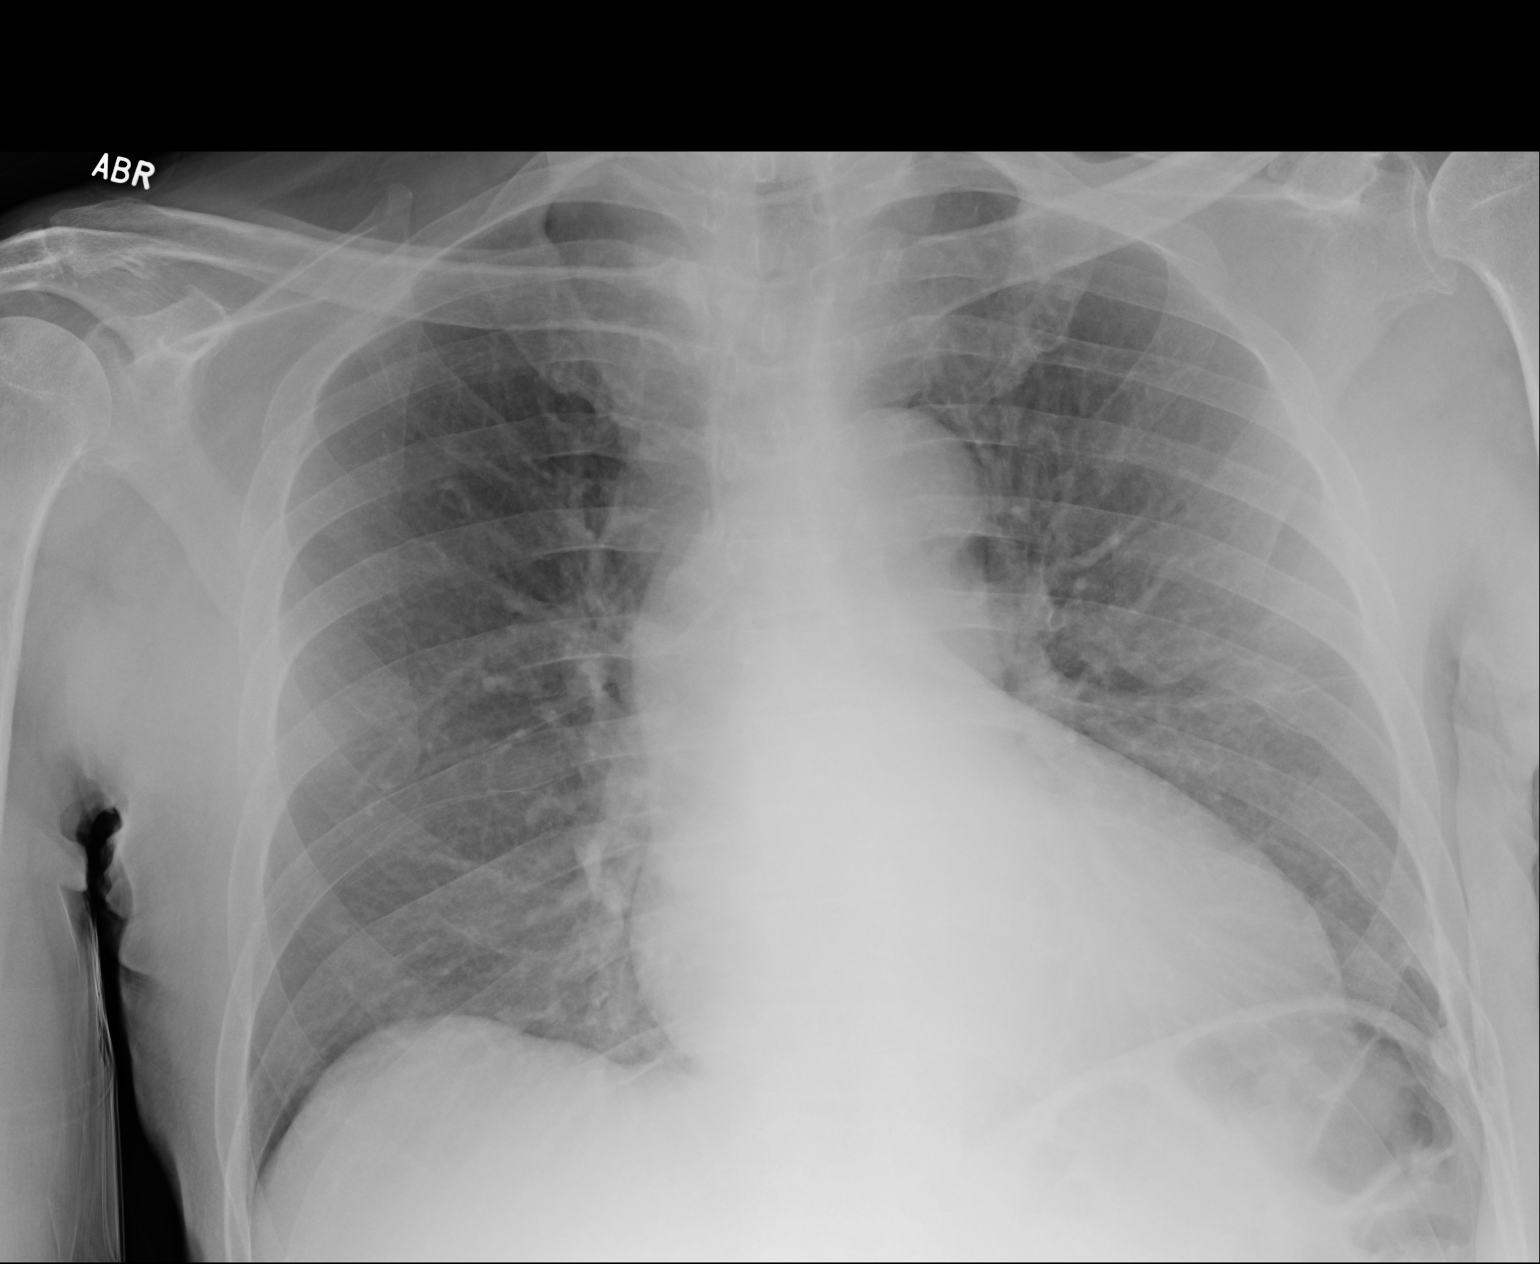

[2 of 2 positions shown; findings below may reference images not displayed]

FINDINGS: Features of interstitial edema with cephalized vascularity septal
thickening. No focal consolidation. No pneumothorax or effusion.
Prominent cardiac silhouette may be related to portable technique.
The aorta is calcified and mildly tortuous. No acute osseous or soft
tissue abnormality.
IMPRESSION: Findings compatible with CHF.

## 2021-11-10 IMAGING — CT CT ABD-PELV W/ CM
2 of 5 series · 16 of 46 positions shown, 18 images · IV contrast (Omni 300)
Comparison: None.

CLINICAL DATA: New rectal bleeding, history of colorectal cancer

EXAM:
CT ABDOMEN AND PELVIS WITH CONTRAST
TECHNIQUE: Multidetector CT imaging of the abdomen and pelvis was performed
using the standard protocol following bolus administration of
intravenous contrast.
CONTRAST:  100mL OMNIPAQUE IOHEXOL 300 MG/ML  SOLN

[Series 3: a/p w/ 5mm · axial · 0.77mm/px · z∈[-380,+20]mm · 13 of 90 slices shown, 15 images]
[im 5/90  soft-tissue]
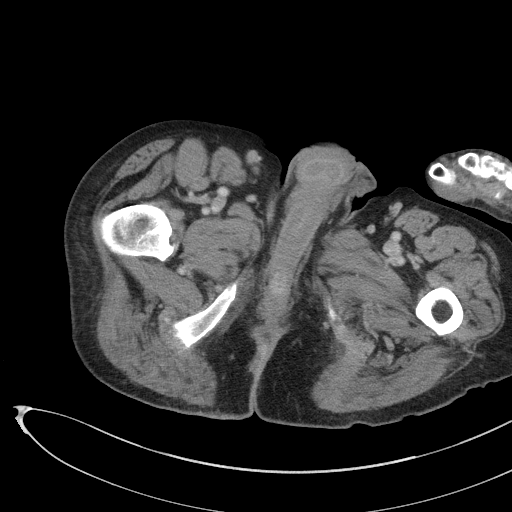
[im 5/90  bone]
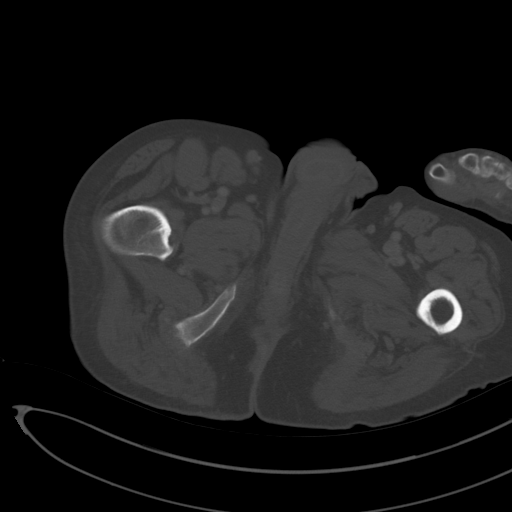
[im 10/90  soft-tissue]
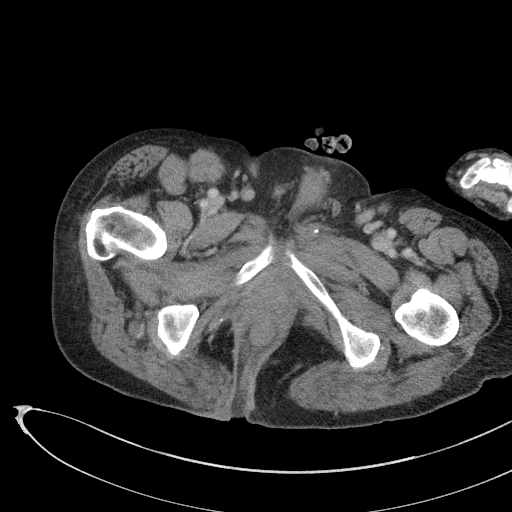
[im 20/90  soft-tissue]
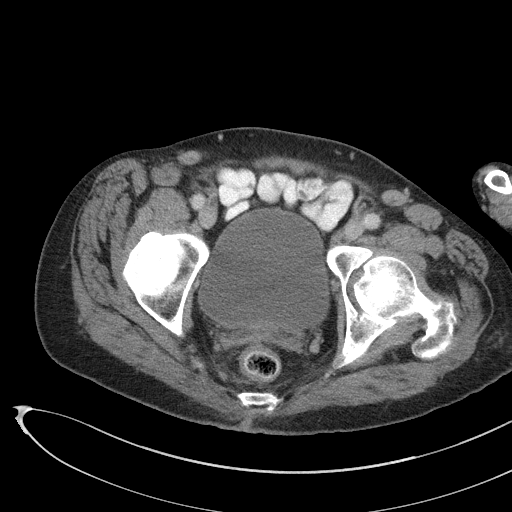
[im 25/90  soft-tissue]
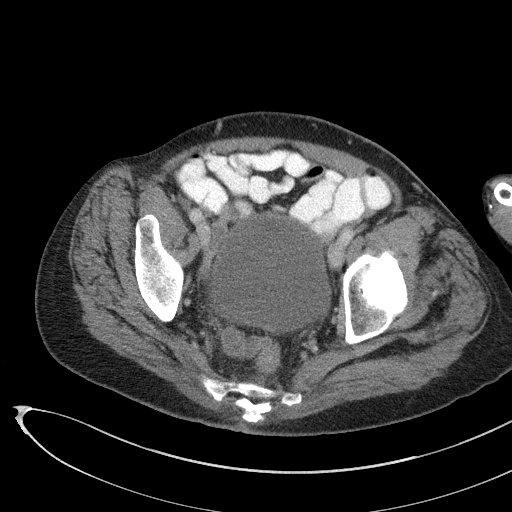
[im 30/90  soft-tissue]
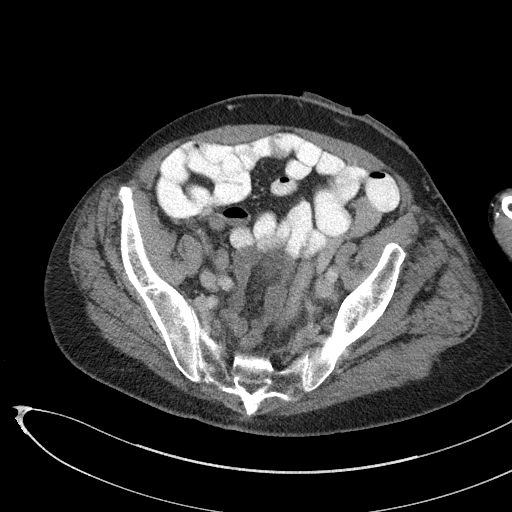
[im 40/90  soft-tissue]
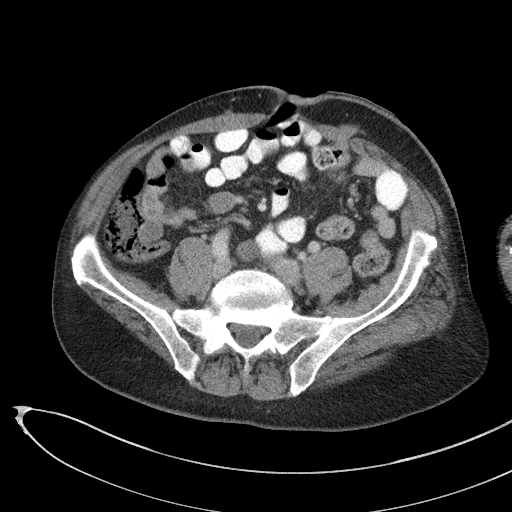
[im 45/90  soft-tissue]
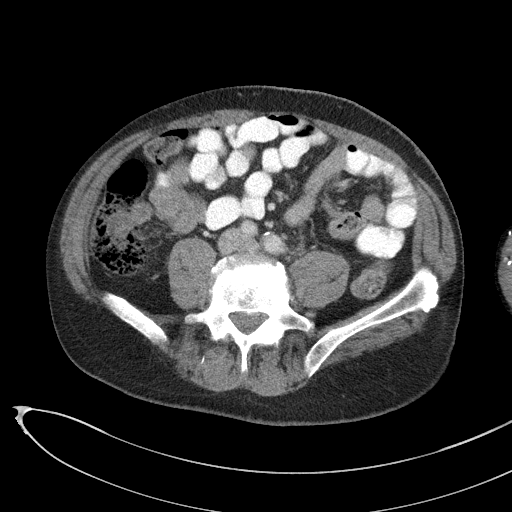
[im 50/90  soft-tissue]
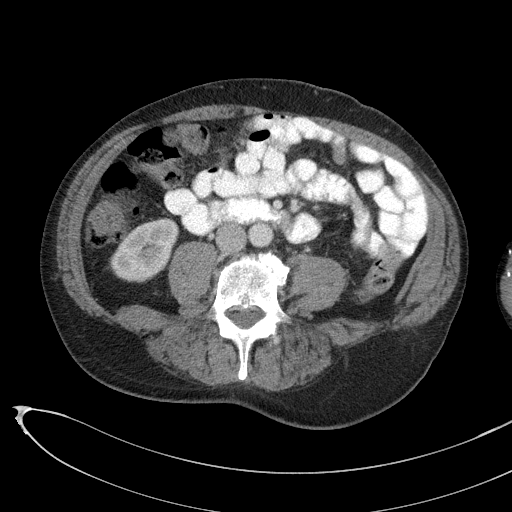
[im 60/90  soft-tissue]
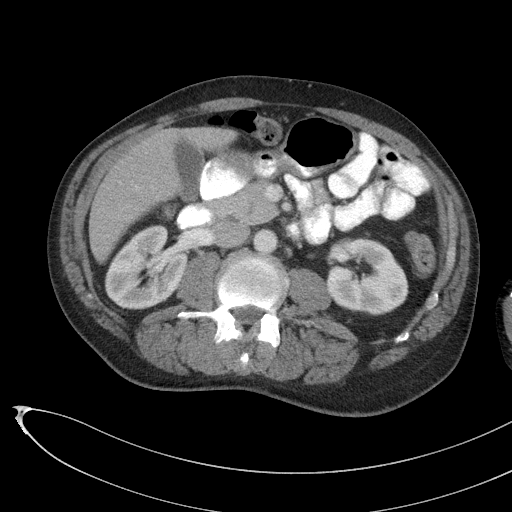
[im 60/90  bone]
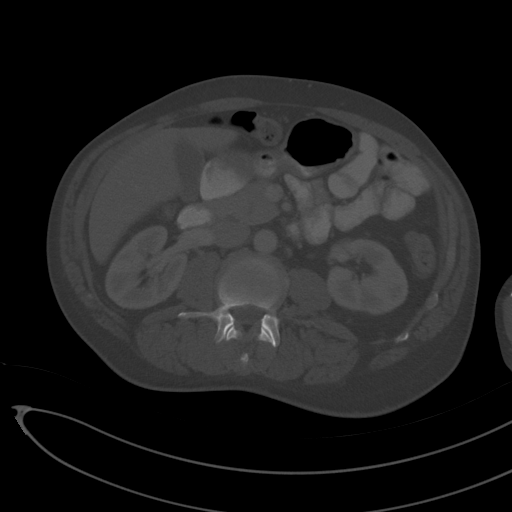
[im 65/90  soft-tissue]
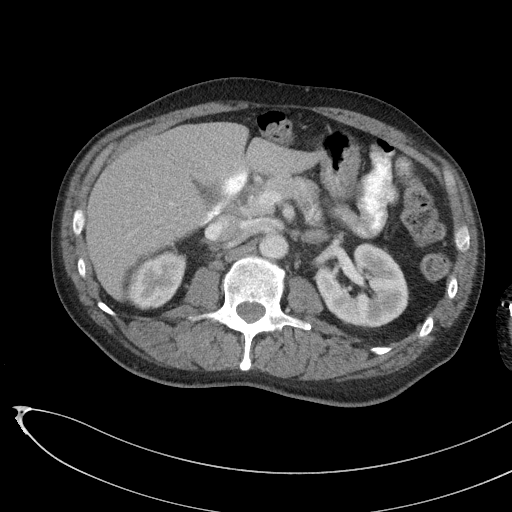
[im 70/90  soft-tissue]
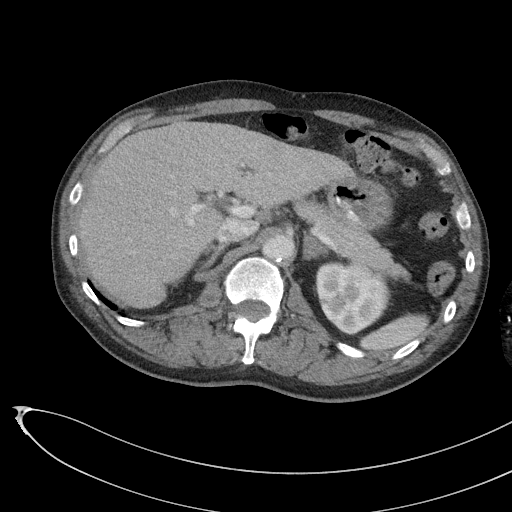
[im 80/90  soft-tissue]
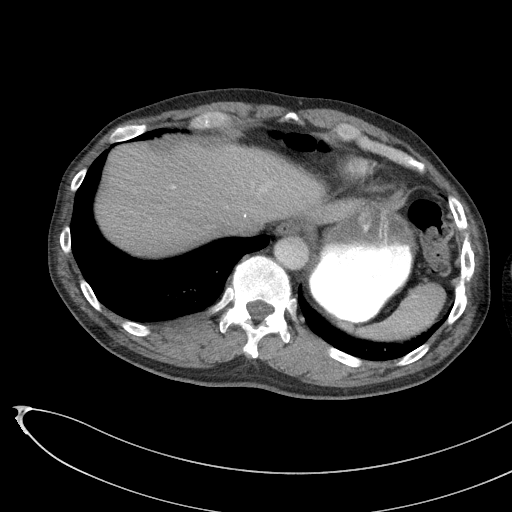
[im 85/90  soft-tissue]
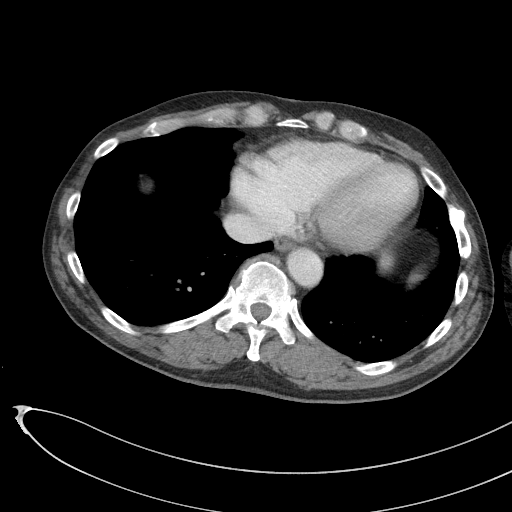

[Series 6: a/p w/ cor · coronal · 0.71mm/px · 3 of 139 slices shown]
[im 47/139  soft-tissue]
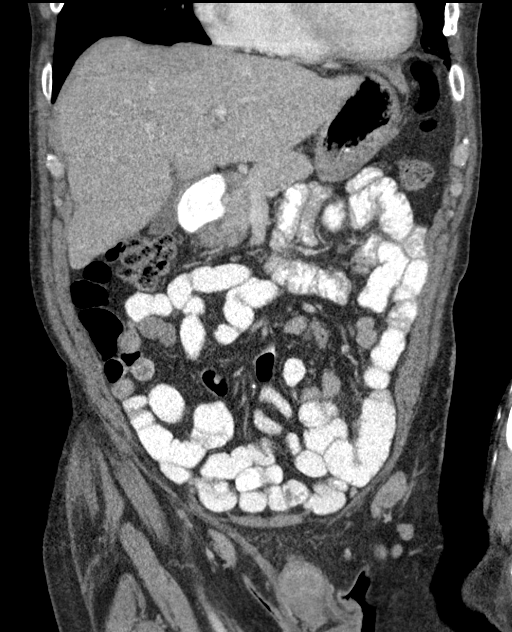
[im 62/139  soft-tissue]
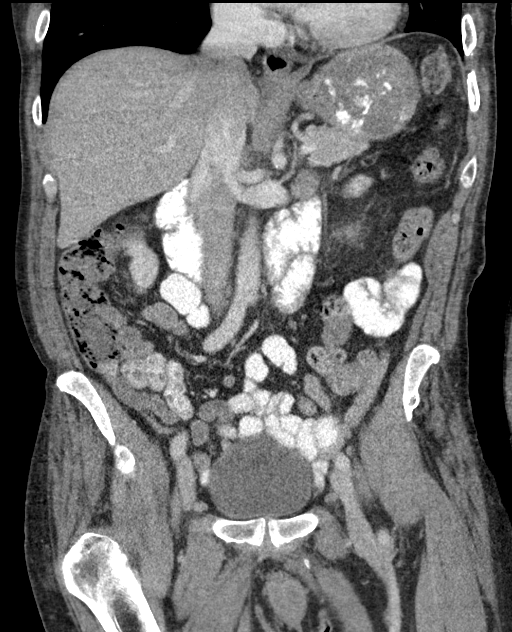
[im 77/139  soft-tissue]
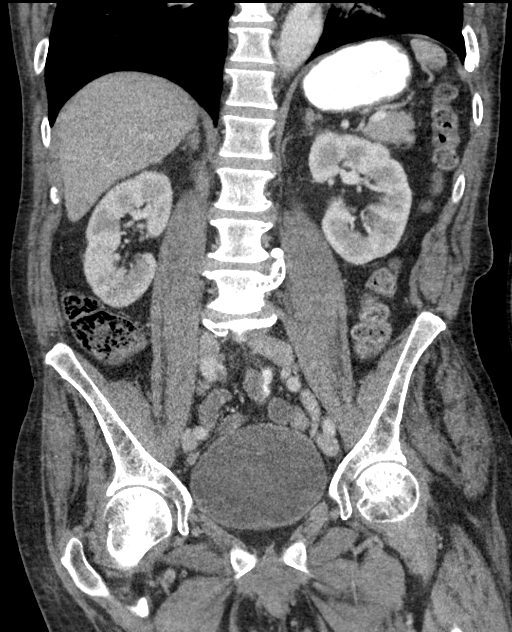

[16 of 46 positions shown; findings below may reference images not displayed]

FINDINGS: Lower chest: Scarring or subsegmental atelectasis in the posterior
basal segment right lower lobe. On the top most image, a 3 mm left
lower lobe nodule is shown (image [DATE]).

Hepatobiliary: Unremarkable.

Pancreas: Unremarkable

Spleen: Unremarkable

Adrenals/Urinary Tract: Fullness of the left adrenal gland,
potential underlying 1.3 by 1.6 cm left adrenal nodule, relative
washout of 53% is consistent with adenoma. Right adrenal gland
normal. The kidneys and urinary bladder appear normal.

Stomach/Bowel: Sigmoid colostomy diversion. Mild wall thickening in
the rectum for example on image 70/3.

Vascular/Lymphatic: Aortoiliac atherosclerotic vascular disease.
Right inguinal lymph node 1.2 cm in short axis on image 79/3. Left
inguinal lymph node 1.2 cm in short axis, image 70/3.

Reproductive: Unremarkable

Other: Soft tissue density along the medial right buttock region
extending towards the anal region, correlate with current or prior
inflammation in this vicinity. A perianal fistula is not readily
excluded given the appearance. Mild distortion of the right
ischiorectal fossa this area.

Musculoskeletal: Old left lower rib fractures. Spondylosis and
degenerative disc disease at L4-5.
IMPRESSION: 1. Soft tissue density along the medial right buttock region
extending towards the anal region, correlate with current or prior
inflammation in this vicinity. A perianal fistula is not readily
excluded given the appearance.
2. Mild wall thickening in the rectum, query mild proctitis.
3. Sigmoid colostomy diversion.
4. Other imaging findings of potential clinical significance: Left
adrenal adenoma. Lumbar spondylosis and degenerative disc disease. 3
mm left lower lobe pulmonary nodule is technically nonspecific
although statistically likely to be benign (surveillance suggested).

Aortic Atherosclerosis ([id number]).
